# Patient Record
Sex: Male | Born: 1975 | Race: Black or African American | Hispanic: No | Marital: Single | Smoking: Former smoker
Health system: Southern US, Community
[De-identification: ages and names within clinical notes are randomized; demographics above are authoritative.]

## PROBLEM LIST (undated history)

## (undated) DIAGNOSIS — Z950 Presence of cardiac pacemaker: Secondary | ICD-10-CM

## (undated) DIAGNOSIS — I10 Essential (primary) hypertension: Secondary | ICD-10-CM

## (undated) DIAGNOSIS — I509 Heart failure, unspecified: Secondary | ICD-10-CM

## (undated) DIAGNOSIS — S0689AA Other specified intracranial injury with loss of consciousness status unknown, initial encounter: Secondary | ICD-10-CM

## (undated) DIAGNOSIS — Z95810 Presence of automatic (implantable) cardiac defibrillator: Secondary | ICD-10-CM

## (undated) DIAGNOSIS — E119 Type 2 diabetes mellitus without complications: Secondary | ICD-10-CM

## (undated) HISTORY — DX: Other specified intracranial injury with loss of consciousness status unknown, initial encounter: S06.89AA

---

## 2021-04-26 ENCOUNTER — Inpatient Hospital Stay (HOSPITAL_COMMUNITY)
Admission: EM | Admit: 2021-04-26 | Discharge: 2021-05-06 | DRG: 026 | Disposition: A | Discharge status: Home / Self Care | Payer: Medicare PPO | Attending: Internal Medicine | Admitting: Internal Medicine

## 2021-04-26 ENCOUNTER — Emergency Department (HOSPITAL_COMMUNITY): Payer: Medicare PPO

## 2021-04-26 ENCOUNTER — Inpatient Hospital Stay (HOSPITAL_COMMUNITY): Payer: Medicare PPO

## 2021-04-26 DIAGNOSIS — R29810 Facial weakness: Secondary | ICD-10-CM | POA: Diagnosis present

## 2021-04-26 DIAGNOSIS — E1122 Type 2 diabetes mellitus with diabetic chronic kidney disease: Secondary | ICD-10-CM | POA: Diagnosis present

## 2021-04-26 DIAGNOSIS — R569 Unspecified convulsions: Principal | ICD-10-CM

## 2021-04-26 DIAGNOSIS — K5730 Diverticulosis of large intestine without perforation or abscess without bleeding: Secondary | ICD-10-CM | POA: Diagnosis present

## 2021-04-26 DIAGNOSIS — Z95810 Presence of automatic (implantable) cardiac defibrillator: Secondary | ICD-10-CM | POA: Diagnosis not present

## 2021-04-26 DIAGNOSIS — I2510 Atherosclerotic heart disease of native coronary artery without angina pectoris: Secondary | ICD-10-CM | POA: Diagnosis present

## 2021-04-26 DIAGNOSIS — E1165 Type 2 diabetes mellitus with hyperglycemia: Secondary | ICD-10-CM | POA: Diagnosis present

## 2021-04-26 DIAGNOSIS — I130 Hypertensive heart and chronic kidney disease with heart failure and stage 1 through stage 4 chronic kidney disease, or unspecified chronic kidney disease: Secondary | ICD-10-CM | POA: Diagnosis present

## 2021-04-26 DIAGNOSIS — K648 Other hemorrhoids: Secondary | ICD-10-CM | POA: Diagnosis present

## 2021-04-26 DIAGNOSIS — C7931 Secondary malignant neoplasm of brain: Secondary | ICD-10-CM

## 2021-04-26 DIAGNOSIS — Z4502 Encounter for adjustment and management of automatic implantable cardiac defibrillator: Secondary | ICD-10-CM | POA: Diagnosis not present

## 2021-04-26 DIAGNOSIS — K633 Ulcer of intestine: Secondary | ICD-10-CM | POA: Diagnosis present

## 2021-04-26 DIAGNOSIS — I5042 Chronic combined systolic (congestive) and diastolic (congestive) heart failure: Secondary | ICD-10-CM | POA: Diagnosis present

## 2021-04-26 DIAGNOSIS — Z20822 Contact with and (suspected) exposure to covid-19: Secondary | ICD-10-CM | POA: Diagnosis present

## 2021-04-26 DIAGNOSIS — R9431 Abnormal electrocardiogram [ECG] [EKG]: Secondary | ICD-10-CM | POA: Diagnosis not present

## 2021-04-26 DIAGNOSIS — D123 Benign neoplasm of transverse colon: Secondary | ICD-10-CM | POA: Diagnosis not present

## 2021-04-26 DIAGNOSIS — E11649 Type 2 diabetes mellitus with hypoglycemia without coma: Secondary | ICD-10-CM | POA: Diagnosis not present

## 2021-04-26 DIAGNOSIS — I429 Cardiomyopathy, unspecified: Secondary | ICD-10-CM | POA: Diagnosis present

## 2021-04-26 DIAGNOSIS — G8191 Hemiplegia, unspecified affecting right dominant side: Secondary | ICD-10-CM | POA: Diagnosis present

## 2021-04-26 DIAGNOSIS — K529 Noninfective gastroenteritis and colitis, unspecified: Secondary | ICD-10-CM | POA: Diagnosis present

## 2021-04-26 DIAGNOSIS — G939 Disorder of brain, unspecified: Secondary | ICD-10-CM | POA: Diagnosis present

## 2021-04-26 DIAGNOSIS — E782 Mixed hyperlipidemia: Secondary | ICD-10-CM | POA: Diagnosis present

## 2021-04-26 DIAGNOSIS — Z634 Disappearance and death of family member: Secondary | ICD-10-CM

## 2021-04-26 DIAGNOSIS — Z86010 Personal history of colonic polyps: Secondary | ICD-10-CM

## 2021-04-26 DIAGNOSIS — R9089 Other abnormal findings on diagnostic imaging of central nervous system: Secondary | ICD-10-CM | POA: Diagnosis not present

## 2021-04-26 DIAGNOSIS — N182 Chronic kidney disease, stage 2 (mild): Secondary | ICD-10-CM | POA: Diagnosis present

## 2021-04-26 DIAGNOSIS — R911 Solitary pulmonary nodule: Secondary | ICD-10-CM | POA: Diagnosis present

## 2021-04-26 DIAGNOSIS — Z8719 Personal history of other diseases of the digestive system: Secondary | ICD-10-CM | POA: Diagnosis not present

## 2021-04-26 DIAGNOSIS — D122 Benign neoplasm of ascending colon: Secondary | ICD-10-CM | POA: Diagnosis not present

## 2021-04-26 DIAGNOSIS — Z7984 Long term (current) use of oral hypoglycemic drugs: Secondary | ICD-10-CM

## 2021-04-26 DIAGNOSIS — E669 Obesity, unspecified: Secondary | ICD-10-CM | POA: Diagnosis present

## 2021-04-26 DIAGNOSIS — Z794 Long term (current) use of insulin: Secondary | ICD-10-CM

## 2021-04-26 DIAGNOSIS — E0869 Diabetes mellitus due to underlying condition with other specified complication: Secondary | ICD-10-CM | POA: Diagnosis not present

## 2021-04-26 DIAGNOSIS — Z6831 Body mass index (BMI) 31.0-31.9, adult: Secondary | ICD-10-CM | POA: Diagnosis not present

## 2021-04-26 DIAGNOSIS — Z79899 Other long term (current) drug therapy: Secondary | ICD-10-CM

## 2021-04-26 DIAGNOSIS — R933 Abnormal findings on diagnostic imaging of other parts of digestive tract: Secondary | ICD-10-CM | POA: Diagnosis not present

## 2021-04-26 DIAGNOSIS — R634 Abnormal weight loss: Secondary | ICD-10-CM | POA: Diagnosis not present

## 2021-04-26 DIAGNOSIS — Z7982 Long term (current) use of aspirin: Secondary | ICD-10-CM

## 2021-04-26 DIAGNOSIS — D120 Benign neoplasm of cecum: Secondary | ICD-10-CM | POA: Diagnosis present

## 2021-04-26 DIAGNOSIS — K559 Vascular disorder of intestine, unspecified: Secondary | ICD-10-CM | POA: Diagnosis present

## 2021-04-26 DIAGNOSIS — Z87891 Personal history of nicotine dependence: Secondary | ICD-10-CM

## 2021-04-26 DIAGNOSIS — N183 Chronic kidney disease, stage 3 unspecified: Secondary | ICD-10-CM | POA: Diagnosis not present

## 2021-04-26 HISTORY — DX: Type 2 diabetes mellitus without complications: E11.9

## 2021-04-26 HISTORY — DX: Heart failure, unspecified: I50.9

## 2021-04-26 HISTORY — DX: Presence of automatic (implantable) cardiac defibrillator: Z95.810

## 2021-04-26 HISTORY — DX: Presence of cardiac pacemaker: Z95.0

## 2021-04-26 HISTORY — DX: Essential (primary) hypertension: I10

## 2021-04-26 LAB — CBC
HCT: 47 % (ref 39.0–52.0)
Hemoglobin: 15.6 g/dL (ref 13.0–17.0)
MCH: 29.9 pg (ref 26.0–34.0)
MCHC: 33.2 g/dL (ref 30.0–36.0)
MCV: 90 fL (ref 80.0–100.0)
Platelets: 286 10*3/uL (ref 150–400)
RBC: 5.22 MIL/uL (ref 4.22–5.81)
RDW: 13 % (ref 11.5–15.5)
WBC: 6 10*3/uL (ref 4.0–10.5)
nRBC: 0 % (ref 0.0–0.2)

## 2021-04-26 LAB — DIFFERENTIAL
Abs Immature Granulocytes: 0.02 10*3/uL (ref 0.00–0.07)
Basophils Absolute: 0 10*3/uL (ref 0.0–0.1)
Basophils Relative: 1 %
Eosinophils Absolute: 0 10*3/uL (ref 0.0–0.5)
Eosinophils Relative: 0 %
Immature Granulocytes: 0 %
Lymphocytes Relative: 18 %
Lymphs Abs: 1.1 10*3/uL (ref 0.7–4.0)
Monocytes Absolute: 0.4 10*3/uL (ref 0.1–1.0)
Monocytes Relative: 7 %
Neutro Abs: 4.4 10*3/uL (ref 1.7–7.7)
Neutrophils Relative %: 74 %

## 2021-04-26 LAB — I-STAT CHEM 8, ED
BUN: 12 mg/dL (ref 6–20)
Calcium, Ion: 1 mmol/L — ABNORMAL LOW (ref 1.15–1.40)
Chloride: 98 mmol/L (ref 98–111)
Creatinine, Ser: 1 mg/dL (ref 0.61–1.24)
Glucose, Bld: 636 mg/dL (ref 70–99)
HCT: 49 % (ref 39.0–52.0)
Hemoglobin: 16.7 g/dL (ref 13.0–17.0)
Potassium: 4.3 mmol/L (ref 3.5–5.1)
Sodium: 133 mmol/L — ABNORMAL LOW (ref 135–145)
TCO2: 24 mmol/L (ref 22–32)

## 2021-04-26 LAB — COMPREHENSIVE METABOLIC PANEL
ALT: 12 U/L (ref 0–44)
AST: 19 U/L (ref 15–41)
Albumin: 3.6 g/dL (ref 3.5–5.0)
Alkaline Phosphatase: 84 U/L (ref 38–126)
Anion gap: 12 (ref 5–15)
BUN: 11 mg/dL (ref 6–20)
CO2: 23 mmol/L (ref 22–32)
Calcium: 9.1 mg/dL (ref 8.9–10.3)
Chloride: 96 mmol/L — ABNORMAL LOW (ref 98–111)
Creatinine, Ser: 1.17 mg/dL (ref 0.61–1.24)
GFR, Estimated: >60 mL/min (ref >60)
Glucose, Bld: 610 mg/dL (ref 70–99)
Potassium: 4.4 mmol/L (ref 3.5–5.1)
Sodium: 131 mmol/L — ABNORMAL LOW (ref 135–145)
Total Bilirubin: 0.8 mg/dL (ref 0.3–1.2)
Total Protein: 7.4 g/dL (ref 6.5–8.1)

## 2021-04-26 LAB — BASIC METABOLIC PANEL
Anion gap: 12 (ref 5–15)
BUN: 10 mg/dL (ref 6–20)
CO2: 19 mmol/L — ABNORMAL LOW (ref 22–32)
Calcium: 8.9 mg/dL (ref 8.9–10.3)
Chloride: 104 mmol/L (ref 98–111)
Creatinine, Ser: 0.91 mg/dL (ref 0.61–1.24)
GFR, Estimated: >60 mL/min (ref >60)
Glucose, Bld: 273 mg/dL — ABNORMAL HIGH (ref 70–99)
Potassium: 4.5 mmol/L (ref 3.5–5.1)
Sodium: 135 mmol/L (ref 135–145)

## 2021-04-26 LAB — MAGNESIUM: Magnesium: 2.2 mg/dL (ref 1.7–2.4)

## 2021-04-26 LAB — RAPID HIV SCREEN (HIV 1/2 AB+AG)
HIV 1/2 Antibodies: NONREACTIVE
HIV-1 P24 Antigen - HIV24: NONREACTIVE

## 2021-04-26 LAB — CBG MONITORING, ED
Glucose-Capillary: >600 mg/dL (ref 70–99)
Glucose-Capillary: 391 mg/dL — ABNORMAL HIGH (ref 70–99)

## 2021-04-26 LAB — TROPONIN I (HIGH SENSITIVITY)
Troponin I (High Sensitivity): 25 ng/L — ABNORMAL HIGH (ref <18)
Troponin I (High Sensitivity): 22 ng/L — ABNORMAL HIGH (ref <18)

## 2021-04-26 LAB — OSMOLALITY: Osmolality: 308 mOsm/kg — ABNORMAL HIGH (ref 275–295)

## 2021-04-26 LAB — URINALYSIS, ROUTINE W REFLEX MICROSCOPIC
Bacteria, UA: NONE SEEN
Bilirubin Urine: NEGATIVE
Glucose, UA: >=500 mg/dL — AB
Hgb urine dipstick: NEGATIVE
Ketones, ur: NEGATIVE mg/dL
Leukocytes,Ua: NEGATIVE
Nitrite: NEGATIVE
Protein, ur: 100 mg/dL — AB
Specific Gravity, Urine: 1.026 (ref 1.005–1.030)
pH: 6 (ref 5.0–8.0)

## 2021-04-26 LAB — GLUCOSE, CAPILLARY
Glucose-Capillary: 197 mg/dL — ABNORMAL HIGH (ref 70–99)
Glucose-Capillary: 338 mg/dL — ABNORMAL HIGH (ref 70–99)

## 2021-04-26 LAB — I-STAT VENOUS BLOOD GAS, ED
Acid-Base Excess: 0 mmol/L (ref 0.0–2.0)
Bicarbonate: 24.9 mmol/L (ref 20.0–28.0)
Calcium, Ion: 1.07 mmol/L — ABNORMAL LOW (ref 1.15–1.40)
HCT: 49 % (ref 39.0–52.0)
Hemoglobin: 16.7 g/dL (ref 13.0–17.0)
O2 Saturation: 99 %
Potassium: 4.3 mmol/L (ref 3.5–5.1)
Sodium: 132 mmol/L — ABNORMAL LOW (ref 135–145)
TCO2: 26 mmol/L (ref 22–32)
pCO2, Ven: 39.3 mmHg — ABNORMAL LOW (ref 44.0–60.0)
pH, Ven: 7.41 (ref 7.250–7.430)
pO2, Ven: 153 mmHg — ABNORMAL HIGH (ref 32.0–45.0)

## 2021-04-26 LAB — PROTIME-INR
INR: 1 (ref 0.8–1.2)
Prothrombin Time: 12.9 seconds (ref 11.4–15.2)

## 2021-04-26 LAB — APTT: aPTT: 26 seconds (ref 24–36)

## 2021-04-26 LAB — PHOSPHORUS: Phosphorus: 3 mg/dL (ref 2.5–4.6)

## 2021-04-26 LAB — BETA-HYDROXYBUTYRIC ACID: Beta-Hydroxybutyric Acid: 0.34 mmol/L — ABNORMAL HIGH (ref 0.05–0.27)

## 2021-04-26 MED ORDER — ATORVASTATIN CALCIUM 80 MG PO TABS
80.0000 mg | ORAL_TABLET | Freq: Every day | ORAL | Status: DC
  Administered 2021-04-26 – 2021-05-06 (×11): 80 mg via ORAL
  Filled 2021-04-26: qty 1
  Filled 2021-04-26: qty 2
  Filled 2021-04-26 (×9): qty 1

## 2021-04-26 MED ORDER — STROKE: EARLY STAGES OF RECOVERY BOOK
Freq: Once | Status: DC
  Filled 2021-04-26: qty 1

## 2021-04-26 MED ORDER — LORAZEPAM 2 MG/ML IJ SOLN
INTRAMUSCULAR | Status: AC
  Filled 2021-04-26: qty 1

## 2021-04-26 MED ORDER — INSULIN GLARGINE-YFGN 100 UNIT/ML ~~LOC~~ SOLN
10.0000 [IU] | Freq: Once | SUBCUTANEOUS | Status: AC
  Administered 2021-04-26: 10 [IU] via SUBCUTANEOUS
  Filled 2021-04-26: qty 0.1

## 2021-04-26 MED ORDER — CHLORHEXIDINE GLUCONATE 0.12% ORAL RINSE (MEDLINE KIT)
15.0000 mL | Freq: Two times a day (BID) | OROMUCOSAL | Status: DC
  Administered 2021-04-26 – 2021-05-04 (×12): 15 mL via OROMUCOSAL

## 2021-04-26 MED ORDER — ORAL CARE MOUTH RINSE
15.0000 mL | OROMUCOSAL | Status: DC
  Administered 2021-04-26 – 2021-05-04 (×32): 15 mL via OROMUCOSAL

## 2021-04-26 MED ORDER — ONDANSETRON HCL 4 MG PO TABS: 4.0000 mg | ORAL_TABLET | Freq: Four times a day (QID) | ORAL | Status: DC | PRN

## 2021-04-26 MED ORDER — INSULIN ASPART 100 UNIT/ML IJ SOLN
0.0000 [IU] | Freq: Three times a day (TID) | INTRAMUSCULAR | Status: DC
  Administered 2021-04-26: 3 [IU] via SUBCUTANEOUS
  Administered 2021-04-26: 15 [IU] via SUBCUTANEOUS
  Administered 2021-04-27: 8 [IU] via SUBCUTANEOUS
  Administered 2021-04-27: 3 [IU] via SUBCUTANEOUS
  Administered 2021-04-27 – 2021-04-28 (×3): 8 [IU] via SUBCUTANEOUS
  Administered 2021-04-28: 3 [IU] via SUBCUTANEOUS
  Administered 2021-04-29 (×2): 8 [IU] via SUBCUTANEOUS
  Administered 2021-04-29: 5 [IU] via SUBCUTANEOUS
  Administered 2021-04-30: 2 [IU] via SUBCUTANEOUS
  Administered 2021-04-30: 5 [IU] via SUBCUTANEOUS
  Administered 2021-04-30: 3 [IU] via SUBCUTANEOUS
  Administered 2021-05-01: 5 [IU] via SUBCUTANEOUS
  Administered 2021-05-01: 3 [IU] via SUBCUTANEOUS
  Administered 2021-05-01: 8 [IU] via SUBCUTANEOUS
  Administered 2021-05-02 (×2): 3 [IU] via SUBCUTANEOUS
  Administered 2021-05-03: 8 [IU] via SUBCUTANEOUS
  Administered 2021-05-03 (×2): 3 [IU] via SUBCUTANEOUS
  Administered 2021-05-04: 15 [IU] via SUBCUTANEOUS
  Administered 2021-05-04: 2 [IU] via SUBCUTANEOUS
  Administered 2021-05-05 – 2021-05-06 (×4): 8 [IU] via SUBCUTANEOUS
  Administered 2021-05-06: 11 [IU] via SUBCUTANEOUS

## 2021-04-26 MED ORDER — ASPIRIN EC 81 MG PO TBEC
81.0000 mg | DELAYED_RELEASE_TABLET | Freq: Every day | ORAL | Status: DC
  Administered 2021-04-26 – 2021-05-06 (×10): 81 mg via ORAL
  Filled 2021-04-26 (×11): qty 1

## 2021-04-26 MED ORDER — AMIODARONE HCL 200 MG PO TABS
200.0000 mg | ORAL_TABLET | Freq: Every day | ORAL | Status: DC
  Administered 2021-04-26 – 2021-05-06 (×11): 200 mg via ORAL
  Filled 2021-04-26 (×11): qty 1

## 2021-04-26 MED ORDER — CARVEDILOL 12.5 MG PO TABS
25.0000 mg | ORAL_TABLET | Freq: Two times a day (BID) | ORAL | Status: DC
  Administered 2021-04-26 – 2021-05-06 (×19): 25 mg via ORAL
  Filled 2021-04-26 (×19): qty 2

## 2021-04-26 MED ORDER — LORAZEPAM 2 MG/ML IJ SOLN: 4.0000 mg | INTRAMUSCULAR | Status: DC | PRN

## 2021-04-26 MED ORDER — ONDANSETRON HCL 4 MG/2ML IJ SOLN: 4.0000 mg | Freq: Four times a day (QID) | INTRAMUSCULAR | Status: DC | PRN

## 2021-04-26 MED ORDER — SODIUM CHLORIDE 0.9 % IV SOLN: 75.0000 mL/h | INTRAVENOUS | Status: DC

## 2021-04-26 MED ORDER — LORAZEPAM 2 MG/ML IJ SOLN
INTRAMUSCULAR | Status: AC
  Administered 2021-04-26: 2 mg
  Filled 2021-04-26: qty 1

## 2021-04-26 MED ORDER — LEVETIRACETAM IN NACL 1000 MG/100ML IV SOLN
1000.0000 mg | INTRAVENOUS | Status: AC
  Administered 2021-04-26 (×2): 1000 mg via INTRAVENOUS
  Filled 2021-04-26 (×2): qty 100

## 2021-04-26 MED ORDER — ENOXAPARIN SODIUM 40 MG/0.4ML IJ SOSY
40.0000 mg | PREFILLED_SYRINGE | INTRAMUSCULAR | Status: DC
  Administered 2021-04-26 – 2021-04-27 (×2): 40 mg via SUBCUTANEOUS
  Filled 2021-04-26 (×2): qty 0.4

## 2021-04-26 MED ORDER — SODIUM CHLORIDE 0.9% FLUSH
3.0000 mL | Freq: Once | INTRAVENOUS | Status: AC
  Administered 2021-04-26: 3 mL via INTRAVENOUS

## 2021-04-26 MED ORDER — IOHEXOL 350 MG/ML SOLN
60.0000 mL | Freq: Once | INTRAVENOUS | Status: AC | PRN
  Administered 2021-04-26: 60 mL via INTRAVENOUS

## 2021-04-26 MED ORDER — INSULIN ASPART 100 UNIT/ML IJ SOLN
0.0000 [IU] | Freq: Every day | INTRAMUSCULAR | Status: DC
Start: 2021-04-26 — End: 2021-05-06
  Administered 2021-04-26: 4 [IU] via SUBCUTANEOUS
  Administered 2021-04-27 – 2021-04-28 (×2): 5 [IU] via SUBCUTANEOUS
  Administered 2021-04-30 – 2021-05-03 (×3): 3 [IU] via SUBCUTANEOUS
  Administered 2021-05-04: 2 [IU] via SUBCUTANEOUS
  Administered 2021-05-05: 3 [IU] via SUBCUTANEOUS

## 2021-04-26 MED ORDER — SODIUM CHLORIDE 0.9 % IV SOLN
75.0000 mL/h | INTRAVENOUS | Status: AC
  Administered 2021-04-26 – 2021-04-27 (×3): 75 mL/h via INTRAVENOUS

## 2021-04-26 NOTE — ED Notes (Signed)
Patient transported to CT 

## 2021-04-26 NOTE — Consult Note (Addendum)
NEUROLOGY CONSULTATION NOTE   Date of service: April 26, 2021 Patient Name: Jeremy Burns MRN:  [id number] DOB:  25-Oct-1975 Reason for consult: "Seizures and concern for post ictal R sided weakness vs L MCA stroke" Requesting Provider: Lequita Halt, MD _ _ _   _ __   _ __ _ _  __ __   _ __   __ _  History of Present Illness  Jeremy Burns is a 45 y.o. male with PMH significant for DM2, combined systolic and diastolic CHF, CAD, AICD placement, CKD stage 1, mixed hyperlipidemia, HTN who was found down by EMS in front of a stroke. Had a 45 sec seizure enroute. On arrival, not moving Right side, somnolent, and concern for aphasia. Initial CTH w/o contrast with a small insular hypodensity concerning for a stroke. Blood glucose over 600. Vitals otherwise normal. After CT Head, noted to be clearly talking, sat up in the scanner, still weak on the right but able to stand up and urinate. While attempting to get a CT Angio, had about 2 mins of seizure activity with R sided twitching with R gaze deviation. Was given 2mg  of Ativan with resolution. CTA with no LVO.  Loaded with Keppra 2G.   On reevaluation he is able to provide much more history.  Reports that since this morning he has noted that he has had some trouble writing with his right hand.  He did not think too much about it.  He got up in the morning drove over to a shop in his truckand was coming out of the shop and halfway down the shop realize that he left his wallet there.  He went back to get his wallet and that is the last thing that he remembers and then waking up in the emergency department.  mRS: presumed to be 0 LKW: unclear tNK: Not offered, suspect partial seizure is the primary diagnosis and LKW is unclear. Thrombectomy: Not offered due to no LVO. NIHSS components Score: Comment  1a Level of Conscious 0[x]  1[]  2[]  3[]      1b LOC Questions 0[]  1[]  2[x]       1c LOC Commands 0[]  1[]  2[x]       2 Best Gaze 0[x]  1[]  2[]       3 Visual  0[x]  1[]  2[]  3[]      4 Facial Palsy 0[]  1[x]  2[]  3[]      5a Motor Arm - left 0[x]  1[]  2[]  3[]  4[]  UN[]    5b Motor Arm - Right 0[]  1[]  2[]  3[x]  4[]  UN[]    6a Motor Leg - Left 0[x]  1[]  2[]  3[]  4[]  UN[]    6b Motor Leg - Right 0[]  1[]  2[]  3[x]  4[]  UN[]    7 Limb Ataxia 0[x]  1[]  2[]  3[]  UN[]     8 Sensory 0[]  1[]  2[x]  UN[]      9 Best Language 0[]  1[]  2[x]  3[]      10 Dysarthria 0[x]  1[]  2[]  UN[]      11 Extinct. and Inattention 0[]  1[x]  2[]       TOTAL: 15     ROS   Unable to obtain a detailed ROS, PMH, PSH< Fhx, SHx due to post ictal somnolence and post ictal todd's paresis.  Past History  No past medical history on file.  No family history on file. Social History   Socioeconomic History   Marital status: Single    Spouse name: Not on file   Number of children: Not on file   Years of education: Not on file   Highest education  level: Not on file  Occupational History   Not on file  Tobacco Use   Smoking status: Not on file   Smokeless tobacco: Not on file  Substance and Sexual Activity   Alcohol use: Not on file   Drug use: Not on file   Sexual activity: Not on file  Other Topics Concern   Not on file  Social History Narrative   Not on file   Social Determinants of Health   Financial Resource Strain: Not on file  Food Insecurity: Not on file  Transportation Needs: Not on file  Physical Activity: Not on file  Stress: Not on file  Social Connections: Not on file   Not on File  Medications   Medications Prior to Admission  Medication Sig Dispense Refill Last Dose   amiodarone (PACERONE) 200 MG tablet Take 200 mg by mouth daily.      atorvastatin (LIPITOR) 80 MG tablet Take 80 mg by mouth daily.      carvedilol (COREG) 25 MG tablet Take 25 mg by mouth 2 (two) times daily.      furosemide (LASIX) 40 MG tablet Take 40 mg by mouth 2 (two) times daily.      LEVEMIR FLEXTOUCH 100 UNIT/ML FlexTouch Pen Inject into the skin.      losartan (COZAAR) 100 MG tablet Take 100 mg  by mouth daily.      metFORMIN (GLUCOPHAGE-XR) 500 MG 24 hr tablet Take 500 mg by mouth every morning.      Potassium Chloride ER 20 MEQ TBCR Take 2 tablets by mouth daily.      spironolactone (ALDACTONE) 25 MG tablet Take 25 mg by mouth daily.        Vitals   Vitals:   04/26/21 1415 04/26/21 1515 04/26/21 1644 04/26/21 1700  BP: 118/87 107/78 119/76   Pulse: 82 77 80   Resp: (!) 25 (!) 22 18   Temp:   98.1 F (36.7 C)   TempSrc:   Oral   SpO2: 97% 95% 99%   Weight:    42.6 kg  Height:         Body mass index is 13.48 kg/m.  Physical Exam   General: Laying comfortably in bed; in no acute distress.  HENT: Normal oropharynx and mucosa. Normal external appearance of ears and nose.  Neck: Supple, no pain or tenderness  CV: No JVD. No peripheral edema.  Pulmonary: Symmetric Chest rise. Normal respiratory effort.  Abdomen: Soft to touch, non-tender.  Ext: No cyanosis, edema, or deformity  Skin: No rash. Normal palpation of skin.   Musculoskeletal: Normal digits and nails by inspection. No clubbing.   Neurologic Examination  Mental status/Cognition: Alert, oriented to self, place, month and year, good attention.  Speech/language: Bradyphrenic speech, makes a couple errors but improved. Comprehension intact, object naming intact, repetition intact.  Cranial nerves:   CN II Pupils equal and reactive to light, no VF deficits    CN III,IV,VI EOM intact, no gaze preference or deviation, no nystagmus    CN V normal sensation in V1, V2, and V3 segments bilaterally    CN VII Mild R facial droop.   CN VIII normal hearing to speech    CN IX & X normal palatal elevation, no uvular deviation    CN XI 5/5 head turn and 5/5 shoulder shrug bilaterally    CN XII midline tongue protrusion    Motor:  Muscle bulk: normal, tone normal, pronator drift RUE tremor none Mvmt Root Nerve  Muscle Right Left Comments  SA C5/6 Ax Deltoid 4 5   EF C5/6 Mc Biceps 5 5   EE C6/7/8 Rad Triceps 5 5   WF  C6/7 Med FCR     WE C7/8 PIN ECU     F Ab C8/T1 U ADM/FDI 4+ 5   HF L1/2/3 Fem Illopsoas 4 5   KE L2/3/4 Fem Quad 4 5   DF L4/5 D Peron Tib Ant 5 5   PF S1/2 Tibial Grc/Sol 5 5    Reflexes:  Right Left Comments  Pectoralis      Biceps (C5/6) 2 2   Brachioradialis (C5/6) 2 2    Triceps (C6/7) 2 2    Patellar (L3/4) 2 2    Achilles (S1)      Hoffman      Plantar     Jaw jerk    Sensation:  Light touch Decreased mildly in R face, RUE and RLE   Pin prick    Temperature    Vibration   Proprioception    Coordination/Complex Motor:  - Finger to Nose intact BL - Heel to shin unable to do - Rapid alternating movement are normal - Gait:  Deferred.  Labs   CBC:  Recent Labs  Lab 04/26/21 1002 04/26/21 1009  WBC 6.0  --   NEUTROABS 4.4  --   HGB 15.6 16.7  HCT 47.0 49.0  MCV 90.0  --   PLT 286  --     Basic Metabolic Panel:  Lab Results  Component Value Date   NA 132 (L) 04/26/2021   K 4.3 04/26/2021   CO2 23 04/26/2021   GLUCOSE 610 (HH) 04/26/2021   BUN 11 04/26/2021   CREATININE 1.17 04/26/2021   CALCIUM 9.1 04/26/2021   GFRNONAA >60 04/26/2021   Lipid Panel: No results found for: LDLCALC HgbA1c: No results found for: HGBA1C Urine Drug Screen: No results found for: LABOPIA, COCAINSCRNUR, LABBENZ, AMPHETMU, THCU, LABBARB  Alcohol Level No results found for: [place]  CT Head without contrast: Personally reviewed and CTH was negative for a large hypodensity concerning for a large territory infarct or hyperdensity concerning for an ICH  CT angio Head and Neck with contrast: Personally reviewed and no LVO.  MRI Brain: pending  cEEG:  pending  Impression   Jeremy Burns is a 45 y.o. male with PMH significant for DM2, combined systolic and diastolic CHF, CAD, AICD placement, CKD stage 1, mixed hyperlipidemia, HTN who was found down by EMS in front of a store. Had a 45 sec enroute and another 2 mins seizure in the ED with R sided twitching and LOC. Noted to  be weaker on the right immediately after seizure but now much improved.  Low suspicion for a stroke.  I suspect that his seizures were likely induced by hyperglycemia with glucose over 600 at presentation.  On repeat evaluation, he is much improved on his right side.  However, he has brief intermittent period of spacing out.  He had at least 2 of these episodes each lasted about 10-15 secs when I reevaluated him. He could not recall our conversation during those events.  Recommendations  - Keppra load 2g IV once - Will put him on cEEG given noted 2 brief episodes of spacing out for around 10-15 secs when I re-examined him. - Seizure precautions - Ativan 2mg  Iv once for seizure activity lasting more than 3 mins. - Treatment of hyperglycemia per Primary team. - Unclear if his AICD is  MRI compatible. MRI techs are looking into this. - We will continue to follow along. ______________________________________________________________________  Plan discussed with Dr. Roosevelt Locks over secure chat.  This patient is critically ill and at significant risk of neurological worsening, death and care requires constant monitoring of vital signs, hemodynamics,respiratory and cardiac monitoring, neurological assessment, discussion with family, other specialists and medical decision making of high complexity. I spent 45 minutes of neurocritical care time  in the care of  this patient. This was time spent independent of any time provided by nurse practitioner or PA.  Donnetta Simpers [facility] Pager Number [phone] 04/26/2021  5:15 PM   Thank you for the opportunity to take part in the care of this patient. If you have any further questions, please contact the neurology consultation attending.  Signed,  [place] Pager Number [phone] _ _ _   _ __   _ __ _ _  __ __   _ __   __ _

## 2021-04-26 NOTE — ED Notes (Signed)
Dr. Tinnie Gens notified of pt's symptoms-- code stroke called--

## 2021-04-26 NOTE — ED Provider Notes (Addendum)
[facility] EMERGENCY DEPARTMENT Provider Note   CSN: [id number] Arrival date & time: 04/26/21  0277  An emergency department physician performed an initial assessment on this suspected stroke patient at [phone].  History Chief Complaint  Patient presents with   Seizures   Code Stroke    Jeremy Burns is a 45 y.o. male with PMH T2DM insulin-dependent, HTN, HLD, CHF with last EF 15 to 20%, CKD who presents emergency department for evaluation of seizure-like activity and altered mental status.  Patient was found down in his tractor-trailer and EMS witnessed a 45-second episode of seizure activity with bilateral upper extremity general tonic-clonic behavior.  This behavior self aborted in the field and no medications were given.  Patient arrives altered with inability to raise his right arm.  He is minimally responsive to questions and not responsive to commands.   Seizures     No past medical history on file.  Patient Active Problem List   Diagnosis Date Noted   Seizure ([place]) 04/26/2021         No family history on file.     Home Medications Prior to Admission medications   Not on File    Allergies    Patient has no allergy information on record.  Review of Systems   Review of Systems  Unable to perform ROS: Mental status change  Neurological:  Positive for seizures.   Physical Exam Updated Vital Signs BP 131/89   Pulse 91   Temp 98.7 F (37.1 C) (Oral)   Resp (!) 23   Ht _0  (1.778 m)   SpO2 92%   Physical Exam Vitals and nursing note reviewed.  Constitutional:      Appearance: He is well-developed. He is ill-appearing.  HENT:     Head: Normocephalic and atraumatic.  Eyes:     Conjunctiva/sclera: Conjunctivae normal.  Cardiovascular:     Rate and Rhythm: Normal rate and regular rhythm.     Heart sounds: No murmur heard. Pulmonary:     Effort: Pulmonary effort is normal. No respiratory distress.     Breath sounds: Normal  breath sounds.  Abdominal:     Palpations: Abdomen is soft.     Tenderness: There is no abdominal tenderness.  Musculoskeletal:     Cervical back: Neck supple.  Skin:    General: Skin is warm and dry.  Neurological:     Mental Status: He is alert. He is disoriented.     Cranial Nerves: No cranial nerve deficit.     Motor: Weakness present.    ED Results / Procedures / Treatments   Labs (all labs ordered are listed, but only abnormal results are displayed) Labs Reviewed  COMPREHENSIVE METABOLIC PANEL - Abnormal; Notable for the following components:      Result Value   Sodium 131 (*)    Chloride 96 (*)    Glucose, Bld 610 (*)    All other components within normal limits  BETA-HYDROXYBUTYRIC ACID - Abnormal; Notable for the following components:   Beta-Hydroxybutyric Acid 0.34 (*)    All other components within normal limits  URINALYSIS, ROUTINE W REFLEX MICROSCOPIC - Abnormal; Notable for the following components:   Color, Urine STRAW (*)    Glucose, UA >=500 (*)    Protein, ur 100 (*)    All other components within normal limits  I-STAT CHEM 8, ED - Abnormal; Notable for the following components:   Sodium 133 (*)    Glucose, Bld 636 (*)  Calcium, Ion 1.00 (*)    All other components within normal limits  CBG MONITORING, ED - Abnormal; Notable for the following components:   Glucose-Capillary >600 (*)    All other components within normal limits  I-STAT VENOUS BLOOD GAS, ED - Abnormal; Notable for the following components:   pCO2, Ven 39.3 (*)    pO2, Ven 153.0 (*)    Sodium 132 (*)    Calcium, Ion 1.07 (*)    All other components within normal limits  PROTIME-INR  APTT  CBC  DIFFERENTIAL  BLOOD GAS, VENOUS  HEMOGLOBIN A1C  RAPID HIV SCREEN (HIV 1/2 AB+AG)  RAPID URINE DRUG SCREEN, HOSP PERFORMED  I-STAT CHEM 8, ED  TROPONIN I (HIGH SENSITIVITY)    EKG None  Radiology DG Chest Portable 1 View  Result Date: 04/26/2021 CLINICAL DATA:  Altered mental  status, seizure EXAM: PORTABLE CHEST 1 VIEW COMPARISON:  None. FINDINGS: Dual lead left-sided implanted cardiac device. Cardiomegaly. No focal airspace consolidation, pleural effusion, or pneumothorax. IMPRESSION: Cardiomegaly.  No focal airspace disease. Electronically Signed   By: Davina Poke D.O.   On: 04/26/2021 11:34   CT HEAD CODE STROKE WO CONTRAST  Result Date: 04/26/2021 CLINICAL DATA:  Code stroke.  Neuro deficit, acute, stroke suspected EXAM: CT HEAD WITHOUT CONTRAST TECHNIQUE: Contiguous axial images were obtained from the base of the skull through the vertex without intravenous contrast. COMPARISON:  None. FINDINGS: Brain: Small area of hypoattenuation and loss of gray differentiation in the posterior left insula (series 3, image 16; series 6, image 39). No acute hemorrhage. No mass lesion, midline shift, extra-axial fluid collection, or hydrocephalus. Vascular: No hyperdense vessel identified. Skull: No acute fracture. Sinuses/Orbits: No substantial paranasal sinus disease. Unremarkable orbits. Other: No mastoid effusions. ASPECTS [facility] Stroke Program Early CT Score) - Ganglionic level infarction (caudate, lentiform nuclei, internal capsule, insula, M1-M3 cortex): 6 - Supraganglionic infarction (M4-M6 cortex): 3 Total score (0-10 with 10 being normal): 9 IMPRESSION: 1. Small area of hypoattenuation and loss of gray differentiation in the posterior left insula, possibly small left acute MCA territory infarct versus artifact. ASPECTS 9. MRI could further evaluate if clinically indicated. 2. No acute hemorrhage. Code stroke imaging results were communicated on 04/26/2021 at 10:05 am to provider Dr. Lorrin Goodell via telephone, who verbally acknowledged these results. Electronically Signed   By: Margaretha Sheffield M.D.   On: 04/26/2021 10:10   CT ANGIO HEAD NECK W WO CM (CODE STROKE)  Result Date: 04/26/2021 CLINICAL DATA:  Stroke/TIA, assess intracranial arteries EXAM: CT ANGIOGRAPHY HEAD  AND NECK TECHNIQUE: Multidetector CT imaging of the head and neck was performed using the standard protocol during bolus administration of intravenous contrast. Multiplanar CT image reconstructions and MIPs were obtained to evaluate the vascular anatomy. Carotid stenosis measurements (when applicable) are obtained utilizing NASCET criteria, using the distal internal carotid diameter as the denominator. CONTRAST:  56m OMNIPAQUE IOHEXOL 350 MG/ML SOLN COMPARISON:  None. FINDINGS: CTA NECK FINDINGS Aortic arch: Great vessel origins are patent. Right carotid system: No evidence of dissection, stenosis (50% or greater) or occlusion. Left carotid system: No evidence of dissection, stenosis (50% or greater) or occlusion. Vertebral arteries: Left dominant. No evidence of dissection, stenosis (50% or greater) or occlusion. Skeleton: No evidence of acute abnormality. Other neck: No acute abnormality. Upper chest: Patchy opacities in the visualized lung apices Review of the MIP images confirms the above findings CTA HEAD FINDINGS Evaluation limited by venous contamination.  Within this limitation: Anterior circulation: Bilateral intracranial ICAs, MCAs,  and ACAs are patent without evidence of proximal hemodynamically significant stenosis. Limited evaluation the distal MCA vessels due to venous contamination. Posterior circulation: Small/non dominant right vertebral artery. Bilateral intradural vertebral arteries, basilar artery, and posterior cerebral arteries are patent. Mild stenosis of the distal left intradural vertebral artery. Mild multifocal narrowing of bilateral PCAs. No aneurysm identified. Venous sinuses: No evidence of dural venous sinus thrombosis. Review of the MIP images confirms the above findings IMPRESSION: CTA head: Limited study due to venous contamination. No evidence of large vessel occlusion or proximal hemodynamically significant stenosis. CTA neck: 1. No significant (greater than 50%) stenosis. 2.  Patchy opacities in the visualized lung apices, potentially pneumonia or aspiration but evaluation is limited due to expiratory imaging. Recommend dedicated chest imaging. Urgent findings were communicated on 04/26/2021 at 10:21 am to provider Dr. Lorrin Goodell via telephone, who verbally acknowledged these results. Electronically Signed   By: Margaretha Sheffield M.D.   On: 04/26/2021 10:48    Procedures .Critical Care Performed by: Teressa Lower, MD Authorized by: Teressa Lower, MD   Critical care provider statement:    Critical care time (minutes):  80   Critical care was necessary to treat or prevent imminent or life-threatening deterioration of the following conditions: status epilepticus, stroke.   Critical care was time spent personally by me on the following activities:  Ordering and performing treatments and interventions, ordering and review of laboratory studies, ordering and review of radiographic studies, discussions with consultants, pulse oximetry, examination of patient and blood draw for specimens   Medications Ordered in ED Medications  insulin aspart (novoLOG) injection 0-15 Units (has no administration in time range)  insulin glargine-yfgn (SEMGLEE) injection 10 Units (has no administration in time range)  insulin aspart (novoLOG) injection 0-5 Units (has no administration in time range)  chlorhexidine gluconate (MEDLINE KIT) (PERIDEX) 0.12 % solution 15 mL (has no administration in time range)  MEDLINE mouth rinse (has no administration in time range)  enoxaparin (LOVENOX) injection 40 mg (has no administration in time range)  LORazepam (ATIVAN) injection 4 mg (has no administration in time range)  ondansetron (ZOFRAN) tablet 4 mg (has no administration in time range)    Or  ondansetron (ZOFRAN) injection 4 mg (has no administration in time range)  atorvastatin (LIPITOR) tablet 80 mg (has no administration in time range)  carvedilol (COREG) tablet 25 mg (has no  administration in time range)  amiodarone (PACERONE) tablet 200 mg (has no administration in time range)  0.9 %  sodium chloride infusion (has no administration in time range)  sodium chloride flush (NS) 0.9 % injection 3 mL (3 mLs Intravenous Given 04/26/21 1010)  levETIRAcetam (KEPPRA) IVPB 1000 mg/100 mL premix (0 mg Intravenous Stopped 04/26/21 1029)  LORazepam (ATIVAN) 2 MG/ML injection (2 mg  Given 04/26/21 1010)  iohexol (OMNIPAQUE) 350 MG/ML injection 60 mL (60 mLs Intravenous Contrast Given 04/26/21 1028)    ED Course  I have reviewed the triage vital signs and the nursing notes.  Pertinent labs & imaging results that were available during my care of the patient were reviewed by me and considered in my medical decision making (see chart for details).    MDM Rules/Calculators/A&P                          Patient seen the emergency department for evaluation of seizure-like activity and concern for possible stroke.  Physical exam reveals right-sided weakness in the upper extremities in a patient that  appears to be postictal.  Although the patient's initial presentation was consistent with Todd's paralysis after a seizure, patient has no history of seizures and a stroke alert was called as we cannot rule out intracranial pathology as the source of his seizures.  Patient's head CT did show an area of concern in the posterior left insula, but the patient's CT angiogram was unremarkable and incoordination with neurology, we have lower suspicion for stroke at this time.  While in the CAT scanner, the patient had an additional seizure lasting approximately 3 minutes and requiring Ativan and a Keppra load.  The seizure ultimately resolved and the patient returns to a improved mental status baseline but is not completely back to baseline.  Initial CBG greater than 600 and i-STAT showing a glucose of 636.  Additional lab work-up unremarkable with a pH of 7.4 and a very mildly elevated beta  hydroxybutyrate to 0.34.  Patient given sliding scale insulin and very gentle fluid resuscitation as to not fluid overload the patient in the setting of cardiomyopathy.  Neurology recommending continuous EEG as the patient still is not back to baseline, and may be displaying signs of nonconvulsive status.  The patient was admitted to medicine for further monitoring and observation.  Final Clinical Impression(s) / ED Diagnoses Final diagnoses:  Seizure [facility])    Rx / [place] Orders ED Discharge Orders     None        Cherice Glennie, MD 04/26/21 1250    Teressa Lower, MD 04/26/21 1250

## 2021-04-26 NOTE — H&P (Signed)
History and Physical    Jeremy Burns YYT:[id number] DOB: [birth year] DOA: 04/26/2021  PCP: No primary care provider on file. (Confirm with patient/family/NH records and if not entered, this has to be entered at [facility] point of entry) Patient coming from: Home  I have personally briefly reviewed patient's old medical records in [place]  Chief Complaint: Feeling foggy  HPI: Jeremy Burns is a 45 y.o. 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  Patient lives in [place], traveled to [place].  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  Patient reported that he felt fine before he went to sleep last night.  This morning, patient woke up, started to feel foggy and speech was really slow, can't think quite straight", while at the corner store, patient was found collapsed.  EMS arrived and found patient unresponsive, breathing on his own.  While en route to the hospital, patient had 45 seconds of tonic clonic seizure.  On arrival it was found patient had right-sided facial droop and right arm flaccid.  Patient is more awake now, denied any headache, no numbness only weakness of any of the limbs, not tongue bite, no loss of urine or bowel movement.  Denied AICD firing.  ED Course: Vital signs, blood pressure and heart rate within normal limits, afebrile, CT head small area of hypoattenuation loss of gray differentiation in the posterior left insula suspicious for small left acute MCA territory infarct.  Blood work glucose more than 600, mild elevation of beta hydroxy, VBG showed normal pH.  Anion gap within normal limits.  Patient had another episode of tonic-clonic seizure in the ED and Ativan was given and seizure stopped.  Patient postictal.  Review of Systems: As per HPI otherwise  14 point review of systems negative.    No past medical history on file.     has no history on file for tobacco use, alcohol use, and drug use.  Not on File  No family history on file.   Prior to Admission medications   Medication Sig Start Date End Date Taking? Authorizing Provider  amiodarone (PACERONE) 200 MG tablet Take 200 mg by mouth daily. 11/05/20   [provider]  atorvastatin (LIPITOR) 80 MG tablet Take 80 mg by mouth daily. 11/05/20   [provider]  carvedilol (COREG) 25 MG tablet Take 25 mg by mouth 2 (two) times daily. 11/05/20   [provider]  furosemide (LASIX) 40 MG tablet Take 40 mg by mouth 2 (two) times daily. 11/05/20   [provider]  LEVEMIR FLEXTOUCH 100 UNIT/ML FlexTouch Pen Inject into the skin. 04/23/21   [provider]  losartan (COZAAR) 100 MG tablet Take 100 mg by mouth daily. 02/22/21   [provider]  metFORMIN (GLUCOPHAGE-XR) 500 MG 24 hr tablet Take 500 mg by mouth every morning. 11/05/20   [provider]  Potassium Chloride ER 20 MEQ TBCR Take 2 tablets by mouth daily. 02/20/21   [provider]  spironolactone (ALDACTONE) 25 MG tablet Take 25 mg by mouth daily. 03/08/21   [provider]    Physical Exam: Vitals:   04/26/21 1145 04/26/21 1200 04/26/21 1215 04/26/21 1230  BP: (!) 128/92 123/83 125/80 131/89  Pulse: 92 89 (!) 41 91  Resp: 17 14 20  (!) 23  Temp:  TempSrc:      SpO2: 93% 95% 93% 92%  Height:        Constitutional: NAD, calm, comfortable Vitals:   04/26/21 1145 04/26/21 1200 04/26/21 1215 04/26/21 1230  BP: (!) 128/92 123/83 125/80 131/89  Pulse: 92 89 (!) 41 91  Resp: 17 14 20  (!) 23  Temp:      TempSrc:      SpO2: 93% 95% 93% 92%  Height:       Eyes: PERRL, lids and conjunctivae normal ENMT: Mucous membranes are dry. Posterior pharynx clear of any exudate or lesions.Normal dentition.  Neck: normal, supple, no masses, no  thyromegaly Respiratory: clear to auscultation bilaterally, no wheezing, no crackles. Normal respiratory effort. No accessory muscle use.  Cardiovascular: Regular rate and rhythm, no murmurs / rubs / gallops. No extremity edema. 2+ pedal pulses. No carotid bruits.  Abdomen: no tenderness, no masses palpated. No hepatosplenomegaly. Bowel sounds positive.  Musculoskeletal: no clubbing / cyanosis. No joint deformity upper and lower extremities. Good ROM, no contractures. Normal muscle tone.  Skin: no rashes, lesions, ulcers. No induration Neurologic: CN 2-12 grossly intact. Sensation intact, DTR normal. Strength 5/5 in all 4.  No meningeal sign Psychiatric: Normal judgment and insight. Alert and oriented x 3. Normal mood.     Labs on Admission: I have personally reviewed following labs and imaging studies  CBC: Recent Labs  Lab 04/26/21 0959 04/26/21 1002 04/26/21 1009  WBC  --  6.0  --   NEUTROABS  --  4.4  --   HGB 16.7 15.6 16.7  HCT 49.0 47.0 49.0  MCV  --  90.0  --   PLT  --  286  --    Basic Metabolic Panel: Recent Labs  Lab 04/26/21 0959 04/26/21 1002 04/26/21 1009  NA 133* 131* 132*  K 4.3 4.4 4.3  CL 98 96*  --   CO2  --  23  --   GLUCOSE 636* 610*  --   BUN 12 11  --   CREATININE 1.00 1.17  --   CALCIUM  --  9.1  --    GFR: CrCl cannot be calculated (Unknown ideal weight.). Liver Function Tests: Recent Labs  Lab 04/26/21 1002  AST 19  ALT 12  ALKPHOS 84  BILITOT 0.8  PROT 7.4  ALBUMIN 3.6   No results for input(s): LIPASE, AMYLASE in the last 168 hours. No results for input(s): AMMONIA in the last 168 hours. Coagulation Profile: Recent Labs  Lab 04/26/21 1002  INR 1.0   Cardiac Enzymes: No results for input(s): CKTOTAL, CKMB, CKMBINDEX, TROPONINI in the last 168 hours. BNP (last 3 results) No results for input(s): PROBNP in the last 8760 hours. HbA1C: No results for input(s): HGBA1C in the last 72 hours. CBG: Recent Labs  Lab 04/26/21 1001   GLUCAP >600*   Lipid Profile: No results for input(s): CHOL, HDL, LDLCALC, TRIG, CHOLHDL, LDLDIRECT in the last 72 hours. Thyroid Function Tests: No results for input(s): TSH, T4TOTAL, FREET4, T3FREE, THYROIDAB in the last 72 hours. Anemia Panel: No results for input(s): VITAMINB12, FOLATE, FERRITIN, TIBC, IRON, RETICCTPCT in the last 72 hours. Urine analysis:    Component Value Date/Time   COLORURINE STRAW (A) 04/26/2021 1030   APPEARANCEUR CLEAR 04/26/2021 1030   LABSPEC 1.026 04/26/2021 1030   PHURINE 6.0 04/26/2021 1030   GLUCOSEU >=500 (A) 04/26/2021 1030   HGBUR NEGATIVE 04/26/2021 1030   [place] 04/26/2021 [place] 04/26/2021 1030  PROTEINUR 100 (A) 04/26/2021 1030   NITRITE NEGATIVE 04/26/2021 1030   LEUKOCYTESUR NEGATIVE 04/26/2021 1030    Radiological Exams on Admission: DG Chest Portable 1 View  Result Date: 04/26/2021 CLINICAL DATA:  Altered mental status, seizure EXAM: PORTABLE CHEST 1 VIEW COMPARISON:  None. FINDINGS: Dual lead left-sided implanted cardiac device. Cardiomegaly. No focal airspace consolidation, pleural effusion, or pneumothorax. IMPRESSION: Cardiomegaly.  No focal airspace disease. Electronically Signed   By: Davina Poke D.O.   On: 04/26/2021 11:34   CT HEAD CODE STROKE WO CONTRAST  Result Date: 04/26/2021 CLINICAL DATA:  Code stroke.  Neuro deficit, acute, stroke suspected EXAM: CT HEAD WITHOUT CONTRAST TECHNIQUE: Contiguous axial images were obtained from the base of the skull through the vertex without intravenous contrast. COMPARISON:  None. FINDINGS: Brain: Small area of hypoattenuation and loss of gray differentiation in the posterior left insula (series 3, image 16; series 6, image 39). No acute hemorrhage. No mass lesion, midline shift, extra-axial fluid collection, or hydrocephalus. Vascular: No hyperdense vessel identified. Skull: No acute fracture. Sinuses/Orbits: No substantial paranasal sinus disease.  Unremarkable orbits. Other: No mastoid effusions. ASPECTS [facility] Stroke Program Early CT Score) - Ganglionic level infarction (caudate, lentiform nuclei, internal capsule, insula, M1-M3 cortex): 6 - Supraganglionic infarction (M4-M6 cortex): 3 Total score (0-10 with 10 being normal): 9 IMPRESSION: 1. Small area of hypoattenuation and loss of gray differentiation in the posterior left insula, possibly small left acute MCA territory infarct versus artifact. ASPECTS 9. MRI could further evaluate if clinically indicated. 2. No acute hemorrhage. Code stroke imaging results were communicated on 04/26/2021 at 10:05 am to provider Dr. Lorrin Goodell via telephone, who verbally acknowledged these results. Electronically Signed   By: Margaretha Sheffield M.D.   On: 04/26/2021 10:10   CT ANGIO HEAD NECK W WO CM (CODE STROKE)  Result Date: 04/26/2021 CLINICAL DATA:  Stroke/TIA, assess intracranial arteries EXAM: CT ANGIOGRAPHY HEAD AND NECK TECHNIQUE: Multidetector CT imaging of the head and neck was performed using the standard protocol during bolus administration of intravenous contrast. Multiplanar CT image reconstructions and MIPs were obtained to evaluate the vascular anatomy. Carotid stenosis measurements (when applicable) are obtained utilizing NASCET criteria, using the distal internal carotid diameter as the denominator. CONTRAST:  81mL OMNIPAQUE IOHEXOL 350 MG/ML SOLN COMPARISON:  None. FINDINGS: CTA NECK FINDINGS Aortic arch: Great vessel origins are patent. Right carotid system: No evidence of dissection, stenosis (50% or greater) or occlusion. Left carotid system: No evidence of dissection, stenosis (50% or greater) or occlusion. Vertebral arteries: Left dominant. No evidence of dissection, stenosis (50% or greater) or occlusion. Skeleton: No evidence of acute abnormality. Other neck: No acute abnormality. Upper chest: Patchy opacities in the visualized lung apices Review of the MIP images confirms the above  findings CTA HEAD FINDINGS Evaluation limited by venous contamination.  Within this limitation: Anterior circulation: Bilateral intracranial ICAs, MCAs, and ACAs are patent without evidence of proximal hemodynamically significant stenosis. Limited evaluation the distal MCA vessels due to venous contamination. Posterior circulation: Small/non dominant right vertebral artery. Bilateral intradural vertebral arteries, basilar artery, and posterior cerebral arteries are patent. Mild stenosis of the distal left intradural vertebral artery. Mild multifocal narrowing of bilateral PCAs. No aneurysm identified. Venous sinuses: No evidence of dural venous sinus thrombosis. Review of the MIP images confirms the above findings IMPRESSION: CTA head: Limited study due to venous contamination. No evidence of large vessel occlusion or proximal hemodynamically significant stenosis. CTA neck: 1. No significant (greater than 50%) stenosis. 2. Patchy opacities  in the visualized lung apices, potentially pneumonia or aspiration but evaluation is limited due to expiratory imaging. Recommend dedicated chest imaging. Urgent findings were communicated on 04/26/2021 at 10:21 am to provider Dr. Lorrin Goodell via telephone, who verbally acknowledged these results. Electronically Signed   By: Margaretha Sheffield M.D.   On: 04/26/2021 10:48    EKG: Independently reviewed. LBBB (chronic? ) With DVT impression V4 through V6  Assessment/Plan Active Problems:   Seizure ([place])  (please populate well all problems here in Problem List. (For example, if patient is on BP meds at home and you resume or decide to hold them, it is a problem that needs to be her. Same for CAD, COPD, HLD and so on)  New onset of seizure -Still somewhat postictal.  Given patient has had 2 episodes seizure today, neurology recommend patient admitted to PCU and continue with EEG monitoring. -Keppra loading completed, additional Keppra as per neurology. -Seizure  precautions -UDS  Question of TIA -Not sure whether this was part of new onset seizure/prodrome -Add aspirin, continue statin -CT had suspicious for left MCA territory infarct, MRI ordered.  As per cardiology note from [place] fear in 2019, patient had MRI safe dual-chamber AICD/pacemaker insertion in 2018. -Echocardiogram. -Significant history of chronic systolic CHF, will consult permissive hypertension given high risk of CHF decompensation.  IIDM with hyperglycemia, probably impending HHS -Received IV bolus, hold metoprolol due to significant history of chronic systolic CHF and reduced LVEF less than 20%. -IV fluid -Repeat BMP in 4 hours, sliding scale for now.  Chronic systolic CHF -Clinically appears to be severely dehydrated, fluid hydration normal saline 75 mill per hour x1 day -Hold Lasix for today -Continue Coreg  History of paroxysmal VT on AICD and pacemaker -Patient denied AICD firing today.  Check enzymes, magnesium and phosphorus.  Abnormal EKG -No chest pains, denied AICD firing -Check troponin levels. -Echocardiogram  DVT prophylaxis: Lovenox Code Status: Full code Family Communication: Has a sister but can not provide contact info this time Disposition Plan: Expect more than 2 midnight hospital stay Consults called: Neuro Admission status: PCU   Lequita Halt MD [facility] Pager [phone]  04/26/2021, 12:54 PM

## 2021-04-26 NOTE — ED Notes (Signed)
Stroke Response Nurse Documentation Code Documentation  Jeremy Burns is a 45 y.o. male arriving to [facility] ED via  EMS  on 04/26/21 with past medical hx of CAD, CKD, HTN. On No antithrombotic. Code stroke was activated by Trauma Response Nurse who admitted the patient.   Patient from work where [employer] unclear. 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 In route to ED patient had witnessed seizure lasting 45 seconds. Afterwards, patient had had right sided facial droop and right sided weakness.  In CT patient stood up and said "he needed to pee" and proceeded to stand up and use the urinal. During this time patient slightly improved however still had some significant aphasia.   During CTA patient had another witnessed seizure beginning at 1007. 2 mg Ativan given at 1010. 2g Keppra started at 1015.   Patient returned to ED-29 with ED physician and RN. Patient able to answer simple question at this time.  NIHSS 12, see documentation for details and code stroke times. Patient with disoriented, right facial droop, right arm weakness, right leg weakness, and Expressive aphasia  on exam. The following imaging was completed: CT, CTA. Patient is not a candidate for IV Thrombolytic due to presentation and possibility of this being seizure related.. Patient is not a candidate for IR due to low suspicion for LVO.   Care/Plan: Continue to monitor for Seizures, NIHSS and vitals q2.   Bedside handoff with ED RN Peter Congo.    Meda Klinefelter  Stroke Response RN

## 2021-04-26 NOTE — Progress Notes (Signed)
LTM setup at bedside. No skin breakdown noted. No MRI leads used.

## 2021-04-26 NOTE — Progress Notes (Signed)
Met sister at bedside, information updated, all questions answered with my best knowledge.

## 2021-04-26 NOTE — ED Triage Notes (Addendum)
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  On arrival-- pt is nonverbal, right facial droop, right arm flaccid--  IV in left forearm per EMS  Pt has a pacemaker but is nonverbal, unable to tell what type of pacemaker he has.

## 2021-04-26 NOTE — Progress Notes (Signed)
EEG done at bedside. Results pending.  

## 2021-04-26 NOTE — Procedures (Signed)
Patient Name: Jayln Madeira  MRN: [id number]  Epilepsy Attending: Lora Havens  Referring Physician/Provider: Dr Donnetta Simpers Date: 04/26/2021 Duration: 25.05 mins  Patient history: 45 year old male with 2 minutes of seizure activity described as right-sided twitching with right gaze deviation followed by right-sided weakness.  EEG to evaluate for seizures.  Level of alertness: Awake, asleep  AEDs during EEG study: LEV  Technical aspects: This EEG study was done with scalp electrodes positioned according to the 10-20 International system of electrode placement. Electrical activity was acquired at a sampling rate of 500Hz  and reviewed with a high frequency filter of 70Hz  and a low frequency filter of 1Hz . EEG data were recorded continuously and digitally stored.   Description: The posterior dominant rhythm consists of 9 Hz activity of moderate voltage (25-35 uV) seen predominantly in posterior head regions, symmetric and reactive to eye opening and eye closing. Physiologic photic driving was not seen during photic stimulation.  Hyperventilation was not performed.     IMPRESSION: This study is within normal limits. No seizures or epileptiform discharges were seen throughout the recording.  Jimmye Wisnieski Barbra Sarks

## 2021-04-26 NOTE — ED Notes (Signed)
Got patient into a gown on the monitor did ekg shown to the er provider

## 2021-04-27 ENCOUNTER — Inpatient Hospital Stay (HOSPITAL_COMMUNITY): Payer: Medicare PPO

## 2021-04-27 DIAGNOSIS — R9431 Abnormal electrocardiogram [ECG] [EKG]: Secondary | ICD-10-CM

## 2021-04-27 LAB — CBC WITH DIFFERENTIAL/PLATELET
Abs Immature Granulocytes: 0.02 10*3/uL (ref 0.00–0.07)
Basophils Absolute: 0 10*3/uL (ref 0.0–0.1)
Basophils Relative: 0 %
Eosinophils Absolute: 0.1 10*3/uL (ref 0.0–0.5)
Eosinophils Relative: 1 %
HCT: 42.9 % (ref 39.0–52.0)
Hemoglobin: 14.4 g/dL (ref 13.0–17.0)
Immature Granulocytes: 0 %
Lymphocytes Relative: 33 %
Lymphs Abs: 2.2 10*3/uL (ref 0.7–4.0)
MCH: 30.1 pg (ref 26.0–34.0)
MCHC: 33.6 g/dL (ref 30.0–36.0)
MCV: 89.6 fL (ref 80.0–100.0)
Monocytes Absolute: 0.5 10*3/uL (ref 0.1–1.0)
Monocytes Relative: 7 %
Neutro Abs: 4 10*3/uL (ref 1.7–7.7)
Neutrophils Relative %: 59 %
Platelets: 274 10*3/uL (ref 150–400)
RBC: 4.79 MIL/uL (ref 4.22–5.81)
RDW: 13.2 % (ref 11.5–15.5)
WBC: 6.7 10*3/uL (ref 4.0–10.5)
nRBC: 0 % (ref 0.0–0.2)

## 2021-04-27 LAB — BASIC METABOLIC PANEL
Anion gap: 8 (ref 5–15)
BUN: 11 mg/dL (ref 6–20)
CO2: 24 mmol/L (ref 22–32)
Calcium: 8.9 mg/dL (ref 8.9–10.3)
Chloride: 106 mmol/L (ref 98–111)
Creatinine, Ser: 0.89 mg/dL (ref 0.61–1.24)
GFR, Estimated: >60 mL/min (ref >60)
Glucose, Bld: 211 mg/dL — ABNORMAL HIGH (ref 70–99)
Potassium: 3.5 mmol/L (ref 3.5–5.1)
Sodium: 138 mmol/L (ref 135–145)

## 2021-04-27 LAB — LIPID PANEL
Cholesterol: 164 mg/dL (ref 0–200)
HDL: 40 mg/dL — ABNORMAL LOW (ref >40)
LDL Cholesterol: 103 mg/dL — ABNORMAL HIGH (ref 0–99)
Total CHOL/HDL Ratio: 4.1 RATIO
Triglycerides: 106 mg/dL (ref <150)
VLDL: 21 mg/dL (ref 0–40)

## 2021-04-27 LAB — HEMOGLOBIN A1C
Hgb A1c MFr Bld: >15.5 % — ABNORMAL HIGH (ref 4.8–5.6)
Mean Plasma Glucose: >398 mg/dL

## 2021-04-27 LAB — ECHOCARDIOGRAM COMPLETE
Area-P 1/2: 5.27 cm2
Height: 70 in
S' Lateral: 7.5 cm
Weight: 1502.66 oz

## 2021-04-27 LAB — GLUCOSE, CAPILLARY
Glucose-Capillary: 280 mg/dL — ABNORMAL HIGH (ref 70–99)
Glucose-Capillary: 189 mg/dL — ABNORMAL HIGH (ref 70–99)
Glucose-Capillary: 285 mg/dL — ABNORMAL HIGH (ref 70–99)
Glucose-Capillary: 360 mg/dL — ABNORMAL HIGH (ref 70–99)

## 2021-04-27 MED ORDER — LEVETIRACETAM 750 MG PO TABS
750.0000 mg | ORAL_TABLET | Freq: Two times a day (BID) | ORAL | Status: DC
  Administered 2021-04-27 – 2021-04-28 (×2): 750 mg via ORAL
  Filled 2021-04-27 (×2): qty 1

## 2021-04-27 MED ORDER — GADOBUTROL 1 MMOL/ML IV SOLN
8.0000 mL | Freq: Once | INTRAVENOUS | Status: AC | PRN
  Administered 2021-04-27: 8 mL via INTRAVENOUS

## 2021-04-27 MED ORDER — SODIUM CHLORIDE 0.9 % IV SOLN
4000.0000 mg | INTRAVENOUS | Status: AC
  Administered 2021-04-27: 4000 mg via INTRAVENOUS
  Filled 2021-04-27: qty 40

## 2021-04-27 MED ORDER — INSULIN GLARGINE-YFGN 100 UNIT/ML ~~LOC~~ SOLN
30.0000 [IU] | Freq: Every day | SUBCUTANEOUS | Status: DC
  Filled 2021-04-27: qty 0.3

## 2021-04-27 MED ORDER — PERFLUTREN LIPID MICROSPHERE
1.0000 mL | INTRAVENOUS | Status: AC | PRN
  Administered 2021-04-27: 2 mL via INTRAVENOUS
  Filled 2021-04-27: qty 10

## 2021-04-27 MED ORDER — INSULIN GLARGINE-YFGN 100 UNIT/ML ~~LOC~~ SOLN
15.0000 [IU] | Freq: Every day | SUBCUTANEOUS | Status: DC
  Administered 2021-04-27: 15 [IU] via SUBCUTANEOUS
  Filled 2021-04-27: qty 0.15

## 2021-04-27 NOTE — Progress Notes (Signed)
Subjective: Patient states he continues to have episodes of confusion overnight.  Denies prior history of seizure.  States he had a left-sided headache which has since resolved.  Denies any neck pain.  ROS: negative except above Examination  Vital signs in last 24 hours: Temp:  [97.6 F (36.4 C)-99.5 F (37.5 C)] 98.6 F (37 C) (10/26 1114) Pulse Rate:  [72-86] 72 (10/26 1114) Resp:  [16-25] 18 (10/26 1114) BP: (97-119)/(58-87) 102/64 (10/26 1114) SpO2:  [94 %-99 %] 97 % (10/26 0739) Weight:  [42.6 kg] 42.6 kg (10/25 1700)  General: lying in bed, NAD CVS: pulse-normal rate and rhythm RS: breathing comfortably, CTAB Extremities: normal, warm  Neuro: MS: Alert, oriented, follows commands CN: pupils equal and reactive,  EOMI, face symmetric, tongue midline, normal sensation over face, Motor: 5/5 strength in all 4 extremities Reflexes: 2+ bilaterally over patella, biceps, plantars: flexor Coordination: normal Gait: not tested  Basic Metabolic Panel: Recent Labs  Lab 04/26/21 0959 04/26/21 1002 04/26/21 1009 04/26/21 1344 04/26/21 1445 04/27/21 0343  NA 133* 131* 132*  --  135 138  K 4.3 4.4 4.3  --  4.5 3.5  CL 98 96*  --   --  104 106  CO2  --  23  --   --  19* 24  GLUCOSE 636* 610*  --   --  273* 211*  BUN 12 11  --   --  10 11  CREATININE 1.00 1.17  --   --  0.91 0.89  CALCIUM  --  9.1  --   --  8.9 8.9  MG  --   --   --  2.2  --   --   PHOS  --   --   --  3.0  --   --     CBC: Recent Labs  Lab 04/26/21 0959 04/26/21 1002 04/26/21 1009 04/27/21 0343  WBC  --  6.0  --  6.7  NEUTROABS  --  4.4  --  4.0  HGB 16.7 15.6 16.7 14.4  HCT 49.0 47.0 49.0 42.9  MCV  --  90.0  --  89.6  PLT  --  286  --  274     Coagulation Studies: Recent Labs    04/26/21 1002  LABPROT 12.9  INR 1.0    Imaging  CT head without contrast 04/26/2021: Small area of hypoattenuation and loss of gray differentiation in the posterior left insula, possibly small left acute MCA  territory infarct versus artifact. ASPECTS 9. MRI could further evaluate if clinically indicated. 2. No acute hemorrhage.  CTA head and neck 04/26/2021:  No significant (greater than 50%) stenosis. 2. Patchy opacities in the visualized lung apices, potentially pneumonia or aspiration but evaluation is limited due to expiratory imaging. Recommend dedicated chest imaging.  Chest x-ray 04/26/2021: Cardiomegaly.  No focal airspace disease.  ASSESSMENT AND PLAN: 45 year old male with past medical history of diabetes type 2, combined systolic and diastolic congestive), coronary artery disease, AICD placement, CKD stage I, mixed hyperlipidemia, hypertension who was found down by EMS.  He had a 45-second seizure in route and another 2-minute seizure in the ED described as right-sided twitching and loss of consciousness.  Also reported having trouble writing with his right hand as well as episodes of staring off since morning.  Focal seizures, new onset Uncontrolled type 2 diabetes with hyperglycemia  - LTM eeg showed seizures arising from left frontal-anterior temporal region during which patient either had no clinical signs or had  trouble speaking.  There was average 1 seizure per hour, lasting about 1 to 2 minutes each.  Recommendations -Will load with IV Keppra 4000 mg once -We will start Keppra 750 mg twice daily -New onset seizures could be provoked due to hyperglycemia. We will also obtain MRI brain with and without contrast to look for any acute abnormality causing focal seizures.  Does have AICD which is MRI compatible -Continue LTM EEG until patient is seizure-free for about 24 hours -If patient continues to have further seizures, will load with Vimpat -management of rest of comorbidities per primary team  I have spent a total of 45 minutes with the patient reviewing hospital notes,  test results, labs and examining the patient as well as establishing an assessment and plan that was  discussed personally with the patient.  > 50% of time was spent in direct patient care.   Zeb Comfort Epilepsy [facility] For questions after 5pm please refer to AMION to reach the Neurologist on call

## 2021-04-27 NOTE — Procedures (Addendum)
Patient Name: Jeremy Burns  MRN: [id number]  Epilepsy Attending: Lora Havens  Referring Physician/Provider: Dr Donnetta Simpers Duration: 04/26/2021 1406 to 1026/2022 1417   Patient history: 45 year old male with 2 minutes of seizure activity described as right-sided twitching with right gaze deviation followed by right-sided weakness.  EEG to evaluate for seizures.   Level of alertness: Awake, asleep   AEDs during EEG study: LEV   Technical aspects: This EEG study was done with scalp electrodes positioned according to the 10-20 International system of electrode placement. Electrical activity was acquired at a sampling rate of 500Hz  and reviewed with a high frequency filter of 70Hz  and a low frequency filter of 1Hz . EEG data were recorded continuously and digitally stored.    Description: The posterior dominant rhythm consists of 9 Hz activity of moderate voltage (25-35 uV) seen predominantly in posterior head regions, symmetric and reactive to eye opening and eye closing.  Sleep was characterized by meticulous, sleep spindles (12 to 14 Hz), maximal frontocentral region.  Seizures were noted to be arising from left frontal-anterior temporal region.  During most of the seizures patient was asleep and no clinical signs were noted.  However, during the seizure at [place] AM on 04/27/2021 patient was working with a therapist and suddenly was noted to have trouble speaking, kept repeating " he said umm", was able to follow simple commands like sitting down on bed.  During all the seizures, EEG initially showed sharply contoured 4 to 5 Hz theta slowing in left frontal-anterior temporal region which then spread to all of left hemisphere followed by right hemisphere and evolved into 2 to 3 Hz high amplitude spike and wave activity.  There was average 1 seizure per hour, lasting about 1 to 2 minutes each.  Last seizure was noted 04/27/2021 at 1132  ABNORMALITY -Focal seizure, left frontal-anterior  temporal region  IMPRESSION: This study showed seizures arising from left frontal-anterior temporal region during which patient either had no clinical signs or had trouble speaking.  There was average 1 seizure per hour, lasting about 1 to 2 minutes each.  Last seizure was on 04/27/2021 at 1132.   Shantale Holtmeyer Barbra Sarks

## 2021-04-27 NOTE — Progress Notes (Signed)
Removed EEG leads for MRI.  [facility] notified

## 2021-04-27 NOTE — Evaluation (Signed)
Speech Language Pathology Evaluation Patient Details Name: Jeremy Burns MRN: [id number] DOB: 1976-05-14 Today's Date: 04/27/2021 Time: 1015-1100 SLP Time Calculation (min) (ACUTE ONLY): 45 min  Problem List:  Patient Active Problem List   Diagnosis Date Noted   Seizure ([place]) 04/26/2021   Past Medical History: No past medical history on file. Past Surgical History: The histories are not reviewed yet. Please review them in the "History" navigator section and refresh this Cave. HPI:  45yo male admitted 04/26/21, found down in his tractor trailer. 45sec episode of sz activity. PMH: DM2, cardiomyopathy/chronic stystolic CHF, AICD, CKD2, HTN, HLD, Paroxysmal VT. MRI pending   Assessment / Plan / Recommendation Clinical Impression  Pt seen at bedside for assessment of cognition, language, and speech production. Pt was awake, alert, and cooperative with unfamiliar therapist. He is very talkative and self distracting. Speech is intact - no dysarthria noted. Receptive and Expressive Language are also intact. Pt is able to follow multistep directions, answer questions, and engage in conversation without apparent word retrievel deficits. Cognitively, pt presents with mild higher level cognitive deficits. He is Ox4 (except day of the week). His immediate and delayed recall is intact, however, he reports difficulty with his memory. Pt exhibited difficulty with serial 7s, but was accurate with a functional mental math task. He struggled with verbal fluency/thought organization task of naming category members. His visuospatial skills appear intact. Pt exhibits difficulty with functional problem solving, reasoning, and awareness of deficits, as he is unable (without cues) to verbalize what he would do to safely get up to the bathroom. 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 ST will continue to follow pt acutely, but will benefit from  continued ST services after DC to maximize safety and independence.    SLP Assessment  SLP Recommendation/Assessment: Patient needs continued Speech Language Pathology Services  SLP Visit Diagnosis: Cognitive communication deficit (R41.841)    Recommendations for follow up therapy are one component of a multi-disciplinary discharge planning process, led by the attending physician.  Recommendations may be updated based on patient status, additional functional criteria and insurance authorization.    Follow Up Recommendations  continued ST intervention at next level of care   Frequency and Duration min 1 x/week  2 weeks      SLP Evaluation Cognition  Overall Cognitive Status: Impaired/Different from baseline Arousal/Alertness: Awake/alert Orientation Level: Oriented X4 Year: 2022 Month: October Day of Week: Incorrect Memory: Appears intact Awareness: Impaired Problem Solving: Impaired Problem Solving Impairment: Verbal basic Executive Function: Reasoning;Organizing;Decision Making Reasoning: Impaired Reasoning Impairment: Verbal basic Organizing: Impaired Organizing Impairment: Verbal basic Decision Making: Impaired Decision Making Impairment: Verbal basic Safety/Judgment: Impaired       Comprehension  Auditory Comprehension Overall Auditory Comprehension: Appears within functional limits for tasks assessed Visual Recognition/Discrimination Discrimination: Within Function Limits Reading Comprehension Reading Status: Within funtional limits    Expression Expression Primary Mode of Expression: Verbal Verbal Expression Overall Verbal Expression: Appears within functional limits for tasks assessed Written Expression Dominant Hand: Right   Oral / Motor  Oral Motor/Sensory Function Overall Oral Motor/Sensory Function: Within functional limits Motor Speech Overall Motor Speech: Appears within functional limits for tasks assessed Intelligibility: Intelligible    GO                   Jeremy Burns B. Jeremy Burns, [facility], [place] Speech Language Pathologist Office: [phone]  Burns, Jeremy 04/27/2021, 11:48 AM

## 2021-04-27 NOTE — Progress Notes (Signed)
Changed device settings for MRI to  ODO  MRI mode/Tachy-therapies to off  Will Program device back to pre-MRI settings after completion of exam, and send transmission.

## 2021-04-27 NOTE — Evaluation (Signed)
Occupational Therapy Evaluation Patient Details Name: Jeremy Burns MRN: [id number] DOB: 1976-02-01 Today's Date: 04/27/2021   History of Present Illness 45 year old male who presented to [facility] after found down in his tractor-trailer and EMS witnessed a 45-second episode of seizure activity with bilateral upper extremity general tonic-clonic behavior, resolved after abotu 2 minutes followed by inability to raise his right arm. PMHx: none on file   Clinical Impression   Lucky was indep PTA, he works part-time as a Conservation officer, historic buildings. He lives in a 1 level home with 3-5 STE with his 74 year old daughter and a "male friend" who can assist with all IADLs. Upon evaluation pt required close min guard for ADLs and mobility without AD for safety only due to seizure precautions and related R sided weakness. Pt's ROM and MMT are [facility] and equal on both side. Pt is Ox4 however pt unable to attend to more than one task at a time, easily distracted by conversation - during upper body bathing task pt conversing about his Uncle and then froze with both talking and bathing and required verbal cues to return to bathing and reported "I forget" when asked about his Uncle. Pt reported concern with his thinking skills and getting his words out. Pt will benefit from continued OT. Recommend d/c to home with OP neuro follow up.      Recommendations for follow up therapy are one component of a multi-disciplinary discharge planning process, led by the attending physician.  Recommendations may be updated based on patient status, additional functional criteria and insurance authorization.   Follow Up Recommendations  Outpatient OT ([facility] for cognition pending pt progress acutely)    Assistance Recommended at Discharge Intermittent Supervision/Assistance  Functional Status Assessment  Patient has had a recent decline in their functional status and demonstrates the ability to make significant improvements in function in a  reasonable and predictable amount of time.  Equipment Recommendations  None recommended by OT    Recommendations for Other Services       Precautions / Restrictions Precautions Precautions:  (seizure) Precaution Comments: seizure Restrictions Weight Bearing Restrictions: No      Mobility Bed Mobility Overal bed mobility: Modified Independent             General bed mobility comments: HOB slightly elevated & use of rail    Transfers Overall transfer level: Needs assistance Equipment used: None Transfers: Sit to/from Stand Sit to Stand: Min guard           General transfer comment: for safety      Balance Overall balance assessment: Needs assistance Sitting-balance support: Feet supported Sitting balance-Leahy Scale: Normal     Standing balance support: No upper extremity supported;During functional activity Standing balance-Leahy Scale: Fair                             ADL either performed or assessed with clinical judgement   ADL Overall ADL's : Needs assistance/impaired Eating/Feeding: Independent;Sitting   Grooming: Min guard;Standing   Upper Body Bathing: Min guard;Sitting   Lower Body Bathing: Min guard;Sit to/from stand   Upper Body Dressing : Min guard;Sitting   Lower Body Dressing: Min guard;Sit to/from stand   Toilet Transfer: Min guard;Ambulation   Toileting- Clothing Manipulation and Hygiene: Min guard       Functional mobility during ADLs: Min guard General ADL Comments: min guard or all for safety due to report of R side weakness with seizure activity.  No LOB or R weakness this session.     Vision Baseline Vision/History: 0 No visual deficits Ability to See in Adequate Light: 0 Adequate Patient Visual Report: No change from baseline       Perception     Praxis      Pertinent Vitals/Pain Pain Assessment: No/denies pain     Hand Dominance Right   Extremity/Trunk Assessment Upper Extremity  Assessment Upper Extremity Assessment: Overall WFL for tasks assessed   Lower Extremity Assessment Lower Extremity Assessment: Overall WFL for tasks assessed   Cervical / Trunk Assessment Cervical / Trunk Assessment: Normal   Communication Communication Communication: No difficulties   Cognition Arousal/Alertness: Awake/alert Behavior During Therapy: WFL for tasks assessed/performed Overall Cognitive Status: Impaired/Different from baseline Area of Impairment: Attention;Memory;Following commands;Safety/judgement;Problem solving                   Current Attention Level: Selective Memory: Decreased short-term memory Following Commands: Follows one step commands consistently;Follows one step commands with increased time Safety/Judgement: Decreased awareness of safety;Decreased awareness of deficits   Problem Solving: Slow processing;Requires verbal cues General Comments: pt is Ox4, unable to multitask such as hold conversation while sponge bathing, often trails of in conversation and after verbal cue back to what he was saying he would state "i forget" - pt's report of his main concern are related to thinking skills, being forgetful and not remeber what he is trying to communicate     General Comments  VSS on RA, continuous EEG - during bathing task, pt talking about his uncle, started laughing and then seemingly frooze and required cues to sustain bathing tasks and when asked what he was trying to say about his uncle he said "i cant remember"    Exercises     Shoulder Instructions      Home Living Family/patient expects to be discharged to:: Private residence Living Arrangements: Children;Non-relatives/Friends (male "friend") Available Help at Discharge: Available PRN/intermittently Type of Home: House Home Access: Stairs to enter [employer] of Steps: 5 at one entrance, 3 at the other   Home Layout: One level     Bathroom Shower/Tub: Medical illustrator: Standard     Home Equipment: None          Prior Functioning/Environment Prior Level of Function : Independent/Modified Independent;Working/employed             Mobility Comments: no AD ADLs Comments: indep, 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 List: Decreased activity tolerance;Impaired balance (sitting and/or standing);Decreased cognition;Decreased safety awareness;Decreased knowledge of precautions      OT Treatment/Interventions: Self-care/ADL training;DME and/or AE instruction;Therapeutic activities;Cognitive remediation/compensation;Patient/family education    OT Goals(Current goals can be found in the care plan section) Acute Rehab OT Goals Patient Stated Goal: no more seizures OT Goal Formulation: With patient Time For Goal Achievement: 05/11/21 Potential to Achieve Goals: Fair ADL Goals Pt Will Perform Grooming: Independently;standing Pt Will Transfer to Toilet: Independently;ambulating Additional ADL Goal #1: Pt will demonstrated ability to sustain divided attention during ADL tasks with minimal verbal cues Additional ADL Goal #2: Pt will indep demonstrated at least one cognitive compensatory technique to promote accuracy in ADL/IADLs  OT Frequency: Min 2X/week   Barriers to D/C: Decreased caregiver support  no on ehome with pt besides his 76 yo daughter sometimes       Co-evaluation              AM-PAC  OT "6 Clicks" Daily Activity     Outcome Measure Help from another person eating meals?: None Help from another person taking care of personal grooming?: A Little Help from another person toileting, which includes using toliet, bedpan, or urinal?: A Little Help from another person bathing (including washing, rinsing, drying)?: A Little Help from another person to put on and taking off regular upper body clothing?: None Help from another person to put on and taking off regular lower body clothing?: A Little 6 Click  Score: 20   End of Session Nurse Communication: Mobility status (pt "freezing" during task)  Activity Tolerance: Patient tolerated treatment well Patient left: in bed;with bed alarm set;with call bell/phone within reach  OT Visit Diagnosis: Muscle weakness (generalized) (M62.81);Other symptoms and signs involving cognitive function                Time: 6701-4103 OT Time Calculation (min): 21 min Charges:  OT General Charges $OT Visit: 1 Visit OT Evaluation $OT Eval Moderate Complexity: 1 Mod   Adaline Trejos A Lanaiya Lantry 04/27/2021, 10:20 AM

## 2021-04-27 NOTE — Progress Notes (Signed)
PT Cancellation Note  Patient Details Name: Jeremy Burns MRN: [id number] DOB: February 03, 1976   Cancelled Treatment:    Reason Eval/Treat Not Completed: Patient currently off unit for MRI. Will check back as schedule allows to initiate PT eval.    Thelma Comp 04/27/2021, 2:56 PM  Rolinda Roan, PT, DPT Acute Rehabilitation Services Pager: [phone] Office: [phone]

## 2021-04-27 NOTE — Progress Notes (Signed)
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 -In the emergency room he was noted to have CBG greater than 600 without DKA -Seen by neurology, loaded with Keppra, EEG ordered   Assessment & Plan:  New onset seizure -EEG abnormal noted epileptiform activity arising from left frontal anterior temporal region -Neurology following, loading with Keppra again, followed by 750 mg twice daily -Possibly provoked by hyperglycemia -Plan for MRI brain today -On long-term EEG at this time  Hyperosmolar nonketotic hyperglycemia -CBG> 600 on admission, without DKA, patient does not remember missing any insulin doses -At baseline on Levemir 75 units twice daily and NovoLog 3 times daily with meals, will increase Semglee to 30 units daily -Follow-up hemoglobin A1c -Metformin on hold  History of CAD History of chronic systolic and diastolic CHF with AICD -EF unknown -Clinically euvolemic at this time, continue carvedilol, amiodarone, statin -Hold Lasix and Aldactone today  Hypertension -BP soft, hold Cozaar  DVT prophylaxis: Lovenox Code Status: Full code Family Communication: Discussed with patient in detail, no family at bedside Disposition Plan:  Status is: Inpatient  Remains inpatient appropriate because: Severity of illness   Consultants:  Neurology  Procedures:   Antimicrobials:    Subjective: -Feels okay today, a little groggy  Objective: Vitals:   04/27/21 0355 04/27/21 0739 04/27/21 1114 04/27/21 1513  BP: 97/70 105/68 102/64   Pulse: 72 75 72    Resp: _0 Temp: 97.6 F (36.4 C) 98 F (36.7 C) 98.6 F (37 C)   TempSrc:   Oral   SpO2: 94% 97%    Weight:    98.1 kg  Height:        Intake/Output Summary (Last 24 hours) at 04/27/2021 1522 Last data filed at 04/27/2021 1512 Gross per 24 hour  Intake 2212 ml  Output 1100 ml  Net 1112 ml   Filed Weights   04/26/21 1700 04/27/21 1513  Weight: 42.6 kg 98.1 kg    Examination:  General exam: Average built male sitting up in bed, AAOx3, no distress, EEG leads on Respiratory system: Clear to auscultation Cardiovascular system: S1 & S2 heard, RRR.  Abd: nondistended, soft and nontender.Normal bowel sounds heard. Central nervous system: Alert and oriented. No focal neurological deficits. Extremities: no edema Skin: No rashes Psychiatry: Mood & affect appropriate.     Data Reviewed:   CBC: Recent Labs  Lab 04/26/21 0959 04/26/21 1002 04/26/21 1009 04/27/21 0343  WBC  --  6.0  --  6.7  NEUTROABS  --  4.4  --  4.0  HGB 16.7 15.6 16.7 14.4  HCT 49.0 47.0 49.0 42.9  MCV  --  90.0  --  89.6  PLT  --  286  --  355   Basic Metabolic Panel: Recent Labs  Lab 04/26/21 0959 04/26/21 1002 04/26/21 1009 04/26/21 1344 04/26/21 1445 04/27/21 0343  NA 133* 131* 132*  --  135 138  K 4.3 4.4 4.3  --  4.5 3.5  CL 98 96*  --   --  104 106  CO2  --  23  --   --  19* 24  GLUCOSE 636* 610*  --   --  273* 211*  BUN 12 11  --   --  10 11  CREATININE 1.00 1.17  --   --  0.91 0.89  CALCIUM  --  9.1  --   --  8.9 8.9  MG  --   --   --  2.2  --   --   PHOS  --   --   --  3.0  --   --    GFR: Estimated Creatinine Clearance: 124.3 mL/min (by C-G formula based on SCr of 0.89 mg/dL). Liver Function Tests: Recent Labs  Lab 04/26/21 1002  AST 19  ALT 12  ALKPHOS 84  BILITOT 0.8  PROT 7.4  ALBUMIN 3.6   No results for input(s): LIPASE, AMYLASE in the last 168 hours. No results for input(s): AMMONIA in the last 168 hours. Coagulation Profile: Recent Labs  Lab  04/26/21 1002  INR 1.0   Cardiac Enzymes: No results for input(s): CKTOTAL, CKMB, CKMBINDEX, TROPONINI in the last 168 hours. BNP (last 3 results) No results for input(s): PROBNP in the last 8760 hours. HbA1C: Recent Labs    04/26/21 1002  HGBA1C >15.5*   CBG: Recent Labs  Lab 04/26/21 1329 04/26/21 1755 04/26/21 2155 04/27/21 0626 04/27/21 1113  GLUCAP 391* 197* 338* 280* 189*   Lipid Profile: Recent Labs    04/27/21 0343  CHOL 164  HDL 40*  LDLCALC 103*  TRIG 106  CHOLHDL 4.1   Thyroid Function Tests: No results for input(s): TSH, T4TOTAL, FREET4, T3FREE, THYROIDAB in the last 72 hours. Anemia Panel: No results for input(s): VITAMINB12, FOLATE, FERRITIN, TIBC, IRON, RETICCTPCT in the last 72 hours. Urine analysis:    Component Value Date/Time   COLORURINE STRAW (A) 04/26/2021 1030   APPEARANCEUR CLEAR 04/26/2021 1030   LABSPEC 1.026 04/26/2021 1030   PHURINE 6.0 04/26/2021 1030   GLUCOSEU >=500 (A) 04/26/2021 1030   HGBUR NEGATIVE 04/26/2021 1030   [place] 04/26/2021 1030   KETONESUR NEGATIVE 04/26/2021 1030   PROTEINUR 100 (A) 04/26/2021 1030   NITRITE NEGATIVE 04/26/2021 1030   LEUKOCYTESUR NEGATIVE 04/26/2021 1030   Sepsis Labs: _0 (procalcitonin:4,lacticidven:4)  )No results found for this or any previous visit (from the past 240 hour(s)).       Radiology Studies: DG Chest Portable 1 View  Result Date: 04/26/2021 CLINICAL DATA:  Altered mental status, seizure EXAM: PORTABLE CHEST 1 VIEW COMPARISON:  None. FINDINGS: Dual lead left-sided implanted cardiac device. Cardiomegaly. No focal airspace consolidation, pleural effusion, or pneumothorax. IMPRESSION: Cardiomegaly.  No focal airspace disease. Electronically Signed   By: Davina Poke D.O.   On: 04/26/2021 11:34   EEG adult  Result Date: 04/26/2021 Lora Havens, MD     04/26/2021  2:41 PM Patient Name: Jeremy Burns MRN: [id number] Epilepsy Attending: Lora Havens Referring Physician/Provider: Dr Donnetta Simpers Date: 04/26/2021 Duration: 25.05 mins Patient history: 45 year old male with 2 minutes of seizure activity described as right-sided twitching with right gaze deviation followed by right-sided weakness.  EEG to evaluate for seizures. Level of alertness: Awake, asleep AEDs during EEG study: LEV Technical aspects: This EEG study was done with scalp electrodes positioned according to the 10-20 International system of electrode placement. Electrical activity was acquired at a sampling rate of _1  and reviewed with a high frequency filter of _2  and a low frequency filter of _3 . EEG data were recorded continuously and digitally stored. Description: The posterior dominant rhythm  consists of 9 Hz activity of moderate voltage (25-35 uV) seen predominantly in posterior head regions, symmetric and reactive to eye opening and eye closing. Physiologic photic driving was not seen during photic stimulation.  Hyperventilation was not performed.   IMPRESSION: This study is within normal limits. No seizures or epileptiform discharges were seen throughout the recording. Priyanka Barbra Sarks   Overnight EEG with video  Result Date: 04/27/2021 Lora Havens, MD     04/27/2021 10:41 AM Patient Name: Jeremy Burns MRN: [id number] Epilepsy Attending: Lora Havens Referring Physician/Provider: Dr Donnetta Simpers Duration: 04/26/2021 1406 to 1026/2022 1000  Patient history: 45 year old male with 2 minutes of seizure activity described as right-sided twitching with right gaze deviation followed by right-sided weakness.  EEG to evaluate for seizures.  Level of alertness: Awake, asleep  AEDs during EEG study: LEV  Technical aspects: This EEG study was done with scalp electrodes positioned according to the 10-20 International system of electrode placement. Electrical activity was acquired at a sampling rate of _0  and reviewed with a high frequency filter of _1  and a low  frequency filter of _2 . EEG data were recorded continuously and digitally stored.  Description: The posterior dominant rhythm consists of 9 Hz activity of moderate voltage (25-35 uV) seen predominantly in posterior head regions, symmetric and reactive to eye opening and eye closing.  Sleep was characterized by meticulous, sleep spindles (12 to 14 Hz), maximal frontocentral region. Seizures were noted to be arising from left frontal-anterior temporal region.  During most of the seizures patient was asleep and no clinical signs were noted.  However, during the seizure at [place] AM on 04/27/2021 patient was working with a therapist and suddenly was noted to have trouble speaking, kept repeating " he said umm", was able to follow simple commands like sitting down on bed.  During all the seizures, EEG initially showed sharply contoured 4 to 5 Hz theta slowing in left frontal-anterior temporal region which then spread to all of left hemisphere followed by right hemisphere and evolved into 2 to 3 Hz high amplitude spike and wave activity.  There was average 1 seizure per hour, lasting about 1 to 2 minutes each. ABNORMALITY -Focal seizure, left frontal-anterior temporal region IMPRESSION: This study showed seizures arising from left frontal-anterior temporal region during which patient either had no clinical signs or had trouble speaking.  There was average 1 seizure per hour, lasting about 1 to 2 minutes each. Lora Havens   CT HEAD CODE STROKE WO CONTRAST  Result Date: 04/26/2021 CLINICAL DATA:  Code stroke.  Neuro deficit, acute, stroke suspected EXAM: CT HEAD WITHOUT CONTRAST TECHNIQUE: Contiguous axial images were obtained from the base of the skull through the vertex without intravenous contrast. COMPARISON:  None. FINDINGS: Brain: Small area of hypoattenuation and loss of gray differentiation in the posterior left insula (series 3, image 16; series 6, image 39). No acute hemorrhage. No mass lesion, midline  shift, extra-axial fluid collection, or hydrocephalus. Vascular: No hyperdense vessel identified. Skull: No acute fracture. Sinuses/Orbits: No substantial paranasal sinus disease. Unremarkable orbits. Other: No mastoid effusions. ASPECTS [facility] Stroke Program Early CT Score) - Ganglionic level infarction (caudate, lentiform nuclei, internal capsule, insula, M1-M3 cortex): 6 - Supraganglionic infarction (M4-M6 cortex): 3 Total score (0-10 with 10 being normal): 9 IMPRESSION: 1. Small area of hypoattenuation and loss of gray differentiation in the posterior left insula, possibly small left acute MCA territory infarct versus artifact. ASPECTS 9. MRI could further evaluate if clinically indicated. 2.  No acute hemorrhage. Code stroke imaging results were communicated on 04/26/2021 at 10:05 am to provider Dr. Lorrin Goodell via telephone, who verbally acknowledged these results. Electronically Signed   By: Margaretha Sheffield M.D.   On: 04/26/2021 10:10   CT ANGIO HEAD NECK W WO CM (CODE STROKE)  Result Date: 04/26/2021 CLINICAL DATA:  Stroke/TIA, assess intracranial arteries EXAM: CT ANGIOGRAPHY HEAD AND NECK TECHNIQUE: Multidetector CT imaging of the head and neck was performed using the standard protocol during bolus administration of intravenous contrast. Multiplanar CT image reconstructions and MIPs were obtained to evaluate the vascular anatomy. Carotid stenosis measurements (when applicable) are obtained utilizing NASCET criteria, using the distal internal carotid diameter as the denominator. CONTRAST:  35m OMNIPAQUE IOHEXOL 350 MG/ML SOLN COMPARISON:  None. FINDINGS: CTA NECK FINDINGS Aortic arch: Great vessel origins are patent. Right carotid system: No evidence of dissection, stenosis (50% or greater) or occlusion. Left carotid system: No evidence of dissection, stenosis (50% or greater) or occlusion. Vertebral arteries: Left dominant. No evidence of dissection, stenosis (50% or greater) or occlusion.  Skeleton: No evidence of acute abnormality. Other neck: No acute abnormality. Upper chest: Patchy opacities in the visualized lung apices Review of the MIP images confirms the above findings CTA HEAD FINDINGS Evaluation limited by venous contamination.  Within this limitation: Anterior circulation: Bilateral intracranial ICAs, MCAs, and ACAs are patent without evidence of proximal hemodynamically significant stenosis. Limited evaluation the distal MCA vessels due to venous contamination. Posterior circulation: Small/non dominant right vertebral artery. Bilateral intradural vertebral arteries, basilar artery, and posterior cerebral arteries are patent. Mild stenosis of the distal left intradural vertebral artery. Mild multifocal narrowing of bilateral PCAs. No aneurysm identified. Venous sinuses: No evidence of dural venous sinus thrombosis. Review of the MIP images confirms the above findings IMPRESSION: CTA head: Limited study due to venous contamination. No evidence of large vessel occlusion or proximal hemodynamically significant stenosis. CTA neck: 1. No significant (greater than 50%) stenosis. 2. Patchy opacities in the visualized lung apices, potentially pneumonia or aspiration but evaluation is limited due to expiratory imaging. Recommend dedicated chest imaging. Urgent findings were communicated on 04/26/2021 at 10:21 am to provider Dr. KLorrin Goodellvia telephone, who verbally acknowledged these results. Electronically Signed   By: FMargaretha SheffieldM.D.   On: 04/26/2021 10:48        Scheduled Meds:   stroke: mapping our early stages of recovery book   Does not apply Once   amiodarone  200 mg Oral Daily   aspirin EC  81 mg Oral Daily   atorvastatin  80 mg Oral Daily   carvedilol  25 mg Oral BID WC   chlorhexidine gluconate (MEDLINE KIT)  15 mL Mouth Rinse BID   enoxaparin (LOVENOX) injection  40 mg Subcutaneous Q24H   insulin aspart  0-15 Units Subcutaneous TID WC   insulin aspart  0-5 Units  Subcutaneous QHS   insulin glargine-yfgn  15 Units Subcutaneous Daily   mouth rinse  15 mL Mouth Rinse 10 times per day   Continuous Infusions:   LOS: 1 day    Time spent: 377m    PrDomenic PoliteMD [facility]   04/27/2021, 3:22 PM

## 2021-04-27 NOTE — Progress Notes (Addendum)
Inpatient Diabetes Program Recommendations  AACE/ADA: New Consensus Statement on Inpatient Glycemic Control (2015)  Target Ranges:  Prepandial:   less than 140 mg/dL      Peak postprandial:   less than 180 mg/dL (1-2 hours)      Critically ill patients:  140 - 180 mg/dL   Lab Results  Component Value Date   GLUCAP 189 (H) 04/27/2021   HGBA1C >15.5 (H) 04/26/2021    Review of Glycemic Control Results for Jeremy Burns, Jeremy Burns (MRN [id number]) as of 04/27/2021 12:48  Ref. Range 04/26/2021 13:29 04/26/2021 17:55 04/26/2021 21:55 04/27/2021 06:26 04/27/2021 11:13  Glucose-Capillary Latest Ref Range: 70 - 99 mg/dL 391 (H) 197 (H) 338 (H) 280 (H) 189 (H)   Diabetes history: DM 2 Outpatient Diabetes medications:  Novolin R 10 units tid with meals Levemir 55 units bid Metformin XR- 500 mg bid Current orders for Inpatient glycemic control:  Novolog moderate tid with meals and HS Semglee 15 units daily  Inpatient Diabetes Program Recommendations:    May consider increasing Semglee to 30 units daily.   Will talk with patient regarding A1C.   Thanks,  Adah Perl, RN, BC-ADM Inpatient Diabetes Coordinator Pager [phone]  (8a-5p)  Addendum [phone]- Spoke with patient at bedside regarding elevated A1C of >15.5%.  He states that he was taking his insulin as ordered but was not checking his blood sugars.  He states that in the past, even the hospital has struggled to get his blood sugars under control.  Discussed goal blood sugars and A1C.  ? If patient was taking insulin based on A1C.  Discussed injection sites and rotation as well.  Told patient that he needs to check blood sugars since he is on insulin.  He also needs very close follow up with PCP.  Even asked if he is keeping insulin cool when working, and he states that he is??  Not sure why blood sugars are so uncontrolled.  Will follow.

## 2021-04-28 LAB — BASIC METABOLIC PANEL
Anion gap: 6 (ref 5–15)
BUN: 11 mg/dL (ref 6–20)
CO2: 22 mmol/L (ref 22–32)
Calcium: 8.5 mg/dL — ABNORMAL LOW (ref 8.9–10.3)
Chloride: 105 mmol/L (ref 98–111)
Creatinine, Ser: 0.98 mg/dL (ref 0.61–1.24)
GFR, Estimated: >60 mL/min (ref >60)
Glucose, Bld: 283 mg/dL — ABNORMAL HIGH (ref 70–99)
Potassium: 3.4 mmol/L — ABNORMAL LOW (ref 3.5–5.1)
Sodium: 133 mmol/L — ABNORMAL LOW (ref 135–145)

## 2021-04-28 LAB — CBC
HCT: 43 % (ref 39.0–52.0)
Hemoglobin: 14 g/dL (ref 13.0–17.0)
MCH: 29.7 pg (ref 26.0–34.0)
MCHC: 32.6 g/dL (ref 30.0–36.0)
MCV: 91.1 fL (ref 80.0–100.0)
Platelets: 239 10*3/uL (ref 150–400)
RBC: 4.72 MIL/uL (ref 4.22–5.81)
RDW: 13.2 % (ref 11.5–15.5)
WBC: 7.4 10*3/uL (ref 4.0–10.5)
nRBC: 0 % (ref 0.0–0.2)

## 2021-04-28 LAB — PROTIME-INR
INR: 1 (ref 0.8–1.2)
Prothrombin Time: 12.7 seconds (ref 11.4–15.2)

## 2021-04-28 LAB — GLUCOSE, CAPILLARY
Glucose-Capillary: 261 mg/dL — ABNORMAL HIGH (ref 70–99)
Glucose-Capillary: 257 mg/dL — ABNORMAL HIGH (ref 70–99)
Glucose-Capillary: 352 mg/dL — ABNORMAL HIGH (ref 70–99)

## 2021-04-28 LAB — APTT: aPTT: 27 seconds (ref 24–36)

## 2021-04-28 MED ORDER — LEVETIRACETAM 250 MG PO TABS
250.0000 mg | ORAL_TABLET | Freq: Once | ORAL | Status: AC
  Administered 2021-04-28: 250 mg via ORAL
  Filled 2021-04-28: qty 1

## 2021-04-28 MED ORDER — SODIUM CHLORIDE 0.9 % IV SOLN: INTRAVENOUS | Status: DC

## 2021-04-28 MED ORDER — LEVETIRACETAM 500 MG PO TABS
1000.0000 mg | ORAL_TABLET | Freq: Two times a day (BID) | ORAL | Status: DC
  Administered 2021-04-28: 1000 mg via ORAL
  Filled 2021-04-28: qty 2

## 2021-04-28 MED ORDER — INSULIN GLARGINE-YFGN 100 UNIT/ML ~~LOC~~ SOLN
40.0000 [IU] | Freq: Every day | SUBCUTANEOUS | Status: DC
  Administered 2021-04-28: 40 [IU] via SUBCUTANEOUS
  Filled 2021-04-28 (×2): qty 0.4

## 2021-04-28 NOTE — Significant Event (Signed)
As NP positioning patient for LP, he began to stutter and have difficulty getting out his words. He was awake. Answered questions and was aware, but just difficulty speaking. This lasted 1 min and patient was fully awake, alert, and oriented afterwards and able to participate in LP.  No tremors, shaking, jerking, eye fluttering or staring noted.   Clance Boll, MSN, APN-BC Neurology Nurse Practitioner Pager [phone]

## 2021-04-28 NOTE — Progress Notes (Signed)
Subjective: States he is feeling well. Does report feeling "fuzzy" once.   ROS: negative except above  Examination  Vital signs in last 24 hours: Temp:  [98 F (36.7 C)-98.6 F (37 C)] 98.6 F (37 C) (10/27 1244) Pulse Rate:  [68-77] 71 (10/27 1244) Resp:  [16-18] 18 (10/27 1244) BP: (96-119)/(71-77) 96/74 (10/27 1244) SpO2:  [94 %-98 %] 98 % (10/27 1244)  General: lying in bed, NAD CVS: pulse-normal rate and rhythm RS: breathing comfortably, CTAB Extremities: normal, warm   Neuro: MS: Alert, oriented, follows commands CN: pupils equal and reactive,  EOMI, face symmetric, tongue midline, normal sensation over face, Motor: 5/5 strength in all 4 extremities Reflexes: 2+ bilaterally over patella, biceps, plantars: flexor Coordination: normal Gait: not tested  Basic Metabolic Panel: Recent Labs  Lab 04/26/21 0959 04/26/21 0959 04/26/21 1002 04/26/21 1009 04/26/21 1344 04/26/21 1445 04/27/21 0343 04/28/21 0245  NA 133*  --  131* 132*  --  135 138 133*  K 4.3  --  4.4 4.3  --  4.5 3.5 3.4*  CL 98  --  96*  --   --  104 106 105  CO2  --   --  23  --   --  19* 24 22  GLUCOSE 636*  --  610*  --   --  273* 211* 283*  BUN 12  --  11  --   --  10 11 11   CREATININE 1.00  --  1.17  --   --  0.91 0.89 0.98  CALCIUM  --    < > 9.1  --   --  8.9 8.9 8.5*  MG  --   --   --   --  2.2  --   --   --   PHOS  --   --   --   --  3.0  --   --   --    < > = values in this interval not displayed.    CBC: Recent Labs  Lab 04/26/21 0959 04/26/21 1002 04/26/21 1009 04/27/21 0343 04/28/21 0245  WBC  --  6.0  --  6.7 7.4  NEUTROABS  --  4.4  --  4.0  --   HGB 16.7 15.6 16.7 14.4 14.0  HCT 49.0 47.0 49.0 42.9 43.0  MCV  --  90.0  --  89.6 91.1  PLT  --  286  --  274 239     Coagulation Studies: Recent Labs    04/26/21 1002 04/28/21 1025  LABPROT 12.9 12.7  INR 1.0 1.0    Imaging MR Brain w and wo contrast 04/27/2021:  Multiple enhancing parenchymal and leptomeningeal  lesion in the left frontoparietal and insular region, nonspecific. Differential diagnosis include leptomeningeal processes such as infection and granulomatous disease versus malignancy. No significant mass effect or hemorrhage.     ASSESSMENT AND PLAN: 45 year old male with past medical history of diabetes type 2, combined systolic and diastolic congestive), coronary artery disease, AICD placement, CKD stage I, mixed hyperlipidemia, hypertension who was found down by EMS.  He had a 45-second seizure in route and another 2-minute seizure in the ED described as right-sided twitching and loss of consciousness.  Also reported having trouble writing with his right hand as well as episodes of staring off since morning.   Focal seizures, new onset Brain lesions -This study showed three seizures arising from left frontal-anterior temporal region during which patient  had no clinical signs. The seizures were noted  on 04/27/2021 at 2005 and 2122, lasting about 3 minutes and on 04/28/2021 at 0837 lasting about 7 minutes.    Recommendations - As patient had 3 more seizures overnight, will increase Keppra to 1000 mg twice daily - Will obtain CT chest, abod and pelvis w contrast for malignancy workup - Plan for Lumbar puncture to look for infection, cytology. This doesn't look typical for bacterial and viral meningitis so not starting broad spectrum antibiotics and antiviral yet.  - Will consider ID consult depending on LP results - Continue LTM EEG until patient is seizure-free for about 24 hours -If patient continues to have further seizures, will load with Vimpat -management of rest of comorbidities per primary team   I have spent a total of 35 minutes with the patient reviewing hospital notes,  test results, labs and examining the patient as well as establishing an assessment and plan that was discussed personally with the patient and Dr Broadus John.  > 50% of time was spent in direct patient care.    Zeb Comfort Epilepsy [facility] For questions after 5pm please refer to AMION to reach the Neurologist on call

## 2021-04-28 NOTE — Procedures (Addendum)
Patient Name: Jeremy Burns  MRN: [id number]  Epilepsy Attending: Lora Havens  Referring Physician/Provider: Dr Donnetta Simpers Duration: 04/27/2021 1801 to 1027/2022 1801   Patient history: 45 year old male with 2 minutes of seizure activity described as right-sided twitching with right gaze deviation followed by right-sided weakness.  EEG to evaluate for seizures.   Level of alertness: Awake, asleep   AEDs during EEG study: LEV   Technical aspects: This EEG study was done with scalp electrodes positioned according to the 10-20 International system of electrode placement. Electrical activity was acquired at a sampling rate of 500Hz  and reviewed with a high frequency filter of 70Hz  and a low frequency filter of 1Hz . EEG data were recorded continuously and digitally stored.    Description: The posterior dominant rhythm consists of 9 Hz activity of moderate voltage (25-35 uV) seen predominantly in posterior head regions, symmetric and reactive to eye opening and eye closing.  Sleep was characterized by meticulous, sleep spindles (12 to 14 Hz), maximal frontocentral region.   Four seizures were recorded on 04/27/2021 at 2005 and 2122, lasting about 3 minutes each and on 04/28/2021 at 0837 and 1207, lasting about 7 minutes and 2.5 minutes respectively. During the seizures patient was laying in bed and no clinical signs were noted.  EEG initially showed sharply contoured 4 to 5 Hz theta slowing in left frontal-anterior temporal region which then spread to all of left hemisphere followed by right hemisphere and evolved into 2 to 3 Hz high amplitude spike and wave activity.     ABNORMALITY -Focal seizure, left frontal-anterior temporal region   IMPRESSION: This study showed four seizures arising from left frontal-anterior temporal region during which patient either had no clinical signs or had trouble getting his words out. The seizures were noted on 04/27/2021 at 2005 and 2122, lasting about 3  minutes and on 04/28/2021 at 0837 and 1027 lasting about 7 minutes and 2.5 minutes respectively.    [name]

## 2021-04-28 NOTE — Procedures (Signed)
LUMBAR PUNCTURE (SPINAL TAP) PROCEDURE NOTE  Indication: seizures   Proceduralists: Clance Boll, NP   Risks of the procedure were dicussed with the patient including post-LP headache, bleeding, infection, weakness/numbness of legs(radiculopathy), death.    Consent obtained from: patient    Procedure Note The patient was prepped and draped, and using sterile technique a 20 gauge quinke spinal needle was inserted in the L3-4 space.    After removing and reinserting needle x 4, NP ended the attempt.  Will get LP under fluoro, hopefully tomorrow. Hold Lovenox until LP completed.   Clance Boll, MSN, APN-BC Neurology Nurse Practitioner Pager [phone]

## 2021-04-28 NOTE — Progress Notes (Signed)
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 -In the emergency room he was noted to have CBG greater than 600 without DKA -Seen by neurology, loaded with Keppra, EEG ordered   Assessment & Plan:  New onset seizures Abnormal MRI-multiple enhancing parenchymal lesions -EEG abnormal noted epileptiform activity arising from left frontal anterior temporal region -Neurology following, continue Keppra, dose increased, LTM with multiple seizures noted -MRI brain concerning for multiple enhancing parenchymal lesions, etiology is unclear -HIV negative, check LDH -Neurology following, plan for CT chest abdomen pelvis to rule out malignancy and LP to rule out infection  Uncontrolled type 2 diabetes mellitus Hyperosmolar nonketotic hyperglycemia -CBG> 600 on admission, without DKA, patient does not remember missing any insulin doses -At baseline on Levemir 55 units twice daily and NovoLog 3 times daily with meals,  -CBGs in 200s, increase Semglee dose -HbA1c> 15.5 -Metformin on hold  History of CAD History of chronic systolic and diastolic CHF with AICD -EF unknown -Clinically euvolemic at this time, continue carvedilol, amiodarone, statin -Hold Lasix and Aldactone today  Hypertension -BP soft, hold Cozaar  DVT prophylaxis: Lovenox Code Status: Full code Family Communication: Discussed with patient in detail, no family at bedside, he declines my offer to update his family Disposition Plan:  Status is: Inpatient  Remains inpatient appropriate because: Severity  of illness   Consultants:  Neurology  Procedures:   Antimicrobials:    Subjective: -LTM positive for multiple seizures, no issues overnight  Objective: Vitals:   04/28/21 0000 04/28/21 0349 04/28/21 0830 04/28/21 1244  BP: 103/75 114/71 119/77 96/74  Pulse: 74 68 75 71  Resp: _0 Temp: 98.2 F (36.8 C) 98.6 F (37 C) 98.6 F (37 C) 98.6 F (37 C)  TempSrc: Oral Oral Oral Oral  SpO2: 94% 98% 98% 98%  Weight:      Height:        Intake/Output Summary (Last 24 hours) at 04/28/2021 1352 Last data filed at 04/28/2021 1300 Gross per 24 hour  Intake 1628.64 ml  Output 300 ml  Net 1328.64 ml   Filed Weights   04/26/21 1700 04/27/21 1513  Weight: 42.6 kg 98.1 kg    Examination:  General exam: Averagely built male sitting up in bed, AAOx3, no distress, mild cognitive delay CVS: S1-S2, regular rate rhythm Lungs: Clear bilaterally Abdomen: Soft, nontender, bowel sounds present Extremities: No edema Skin: No rash on exposed skin Psychiatry: Mood & affect appropriate.     Data Reviewed:   CBC: Recent Labs  Lab 04/26/21 0959 04/26/21 1002 04/26/21 1009 04/27/21 0343 04/28/21 0245  WBC  --  6.0  --  6.7 7.4  NEUTROABS  --  4.4  --  4.0  --   HGB 16.7 15.6 16.7 14.4 14.0  HCT 49.0 47.0 49.0 42.9 43.0  MCV  --  90.0  --  89.6 91.1  PLT  --  286  --  274 383   Basic Metabolic Panel: Recent Labs  Lab 04/26/21 0959 04/26/21 1002 04/26/21 1009 04/26/21 1344 04/26/21 1445 04/27/21 0343 04/28/21 0245  NA  133* 131* 132*  --  135 138 133*  K 4.3 4.4 4.3  --  4.5 3.5 3.4*  CL 98 96*  --   --  104 106 105  CO2  --  23  --   --  19* 24 22  GLUCOSE 636* 610*  --   --  273* 211* 283*  BUN 12 11  --   --  _0 CREATININE 1.00 1.17  --   --  0.91 0.89 0.98  CALCIUM  --  9.1  --   --  8.9 8.9 8.5*  MG  --   --   --  2.2  --   --   --   PHOS  --   --   --  3.0  --   --   --    GFR: Estimated Creatinine Clearance: 112.9 mL/min (by C-G formula  based on SCr of 0.98 mg/dL). Liver Function Tests: Recent Labs  Lab 04/26/21 1002  AST 19  ALT 12  ALKPHOS 84  BILITOT 0.8  PROT 7.4  ALBUMIN 3.6   No results for input(s): LIPASE, AMYLASE in the last 168 hours. No results for input(s): AMMONIA in the last 168 hours. Coagulation Profile: Recent Labs  Lab 04/26/21 1002 04/28/21 1025  INR 1.0 1.0   Cardiac Enzymes: No results for input(s): CKTOTAL, CKMB, CKMBINDEX, TROPONINI in the last 168 hours. BNP (last 3 results) No results for input(s): PROBNP in the last 8760 hours. HbA1C: Recent Labs    04/26/21 1002  HGBA1C >15.5*   CBG: Recent Labs  Lab 04/27/21 1113 04/27/21 1714 04/27/21 2137 04/28/21 0640 04/28/21 1246  GLUCAP 189* 285* 360* 261* 257*   Lipid Profile: Recent Labs    04/27/21 0343  CHOL 164  HDL 40*  LDLCALC 103*  TRIG 106  CHOLHDL 4.1   Thyroid Function Tests: No results for input(s): TSH, T4TOTAL, FREET4, T3FREE, THYROIDAB in the last 72 hours. Anemia Panel: No results for input(s): VITAMINB12, FOLATE, FERRITIN, TIBC, IRON, RETICCTPCT in the last 72 hours. Urine analysis:    Component Value Date/Time   COLORURINE STRAW (A) 04/26/2021 1030   APPEARANCEUR CLEAR 04/26/2021 1030   LABSPEC 1.026 04/26/2021 1030   PHURINE 6.0 04/26/2021 1030   GLUCOSEU >=500 (A) 04/26/2021 1030   HGBUR NEGATIVE 04/26/2021 1030   [place] 04/26/2021 1030   KETONESUR NEGATIVE 04/26/2021 1030   PROTEINUR 100 (A) 04/26/2021 1030   NITRITE NEGATIVE 04/26/2021 1030   LEUKOCYTESUR NEGATIVE 04/26/2021 1030   Sepsis Labs: _1 (procalcitonin:4,lacticidven:4)  )No results found for this or any previous visit (from the past 240 hour(s)).    Radiology Studies: MR BRAIN W WO CONTRAST  Result Date: 04/27/2021 CLINICAL DATA:  Seizure, abnormal neuro exam; frequent new onset left frontotemporal seizure, underlying mass? EXAM: MRI HEAD WITHOUT AND WITH CONTRAST TECHNIQUE: Multiplanar, multiecho  pulse sequences of the brain and surrounding structures were obtained without and with intravenous contrast. CONTRAST:  102m GADAVIST GADOBUTROL 1 MMOL/ML IV SOLN COMPARISON:  Head CT April 26, 2021 FINDINGS: The study is partially degraded by motion. Brain: No acute infarction, hemorrhage, hydrocephalus or extra-axial collection. Multiple enhancing lesions in the left frontoparietal and insular region with some of the lesions appear to be in the parenchyma and some appearing to have is a leptomeningeal component. Minimal associated T2 hyperintensity without significant mass effect. Vascular: Normal flow voids. Skull and upper cervical spine: Normal marrow signal. Sinuses/Orbits: Negative. Other: None. IMPRESSION: Multiple enhancing parenchymal and leptomeningeal lesion  in the left frontoparietal and insular region, nonspecific. Differential diagnosis include leptomeningeal processes such as infection and granulomatous disease versus malignancy. No significant mass effect or hemorrhage. Electronically Signed   By: Pedro Earls M.D.   On: 04/27/2021 16:47   EEG adult  Result Date: 04/26/2021 Lora Havens, MD     04/26/2021  2:41 PM Patient Name: Leeman Johnsey MRN: [id number] Epilepsy Attending: Lora Havens Referring Physician/Provider: Dr Donnetta Simpers Date: 04/26/2021 Duration: 25.05 mins Patient history: 45 year old male with 2 minutes of seizure activity described as right-sided twitching with right gaze deviation followed by right-sided weakness.  EEG to evaluate for seizures. Level of alertness: Awake, asleep AEDs during EEG study: LEV Technical aspects: This EEG study was done with scalp electrodes positioned according to the 10-20 International system of electrode placement. Electrical activity was acquired at a sampling rate of _0  and reviewed with a high frequency filter of _1  and a low frequency filter of _2 . EEG data were recorded continuously and digitally  stored. Description: The posterior dominant rhythm consists of 9 Hz activity of moderate voltage (25-35 uV) seen predominantly in posterior head regions, symmetric and reactive to eye opening and eye closing. Physiologic photic driving was not seen during photic stimulation.  Hyperventilation was not performed.   IMPRESSION: This study is within normal limits. No seizures or epileptiform discharges were seen throughout the recording. Priyanka Barbra Sarks   Overnight EEG with video  Result Date: 04/27/2021 Lora Havens, MD     04/28/2021  9:51 AM Patient Name: Owen Pratte MRN: [id number] Epilepsy Attending: Lora Havens Referring Physician/Provider: Dr Donnetta Simpers Duration: 04/26/2021 1406 to 1026/2022 1417  Patient history: 45 year old male with 2 minutes of seizure activity described as right-sided twitching with right gaze deviation followed by right-sided weakness.  EEG to evaluate for seizures.  Level of alertness: Awake, asleep  AEDs during EEG study: LEV  Technical aspects: This EEG study was done with scalp electrodes positioned according to the 10-20 International system of electrode placement. Electrical activity was acquired at a sampling rate of _3  and reviewed with a high frequency filter of _4  and a low frequency filter of _5 . EEG data were recorded continuously and digitally stored.  Description: The posterior dominant rhythm consists of 9 Hz activity of moderate voltage (25-35 uV) seen predominantly in posterior head regions, symmetric and reactive to eye opening and eye closing.  Sleep was characterized by meticulous, sleep spindles (12 to 14 Hz), maximal frontocentral region. Seizures were noted to be arising from left frontal-anterior temporal region.  During most of the seizures patient was asleep and no clinical signs were noted.  However, during the seizure at [place] AM on 04/27/2021 patient was working with a therapist and suddenly was noted to have trouble speaking, kept  repeating " he said umm", was able to follow simple commands like sitting down on bed.  During all the seizures, EEG initially showed sharply contoured 4 to 5 Hz theta slowing in left frontal-anterior temporal region which then spread to all of left hemisphere followed by right hemisphere and evolved into 2 to 3 Hz high amplitude spike and wave activity.  There was average 1 seizure per hour, lasting about 1 to 2 minutes each.  Last seizure was noted 04/27/2021 at 1132 ABNORMALITY -Focal seizure, left frontal-anterior temporal region IMPRESSION: This study showed seizures arising from left frontal-anterior temporal region during which patient either had no clinical signs or had trouble speaking.  There was average  1 seizure per hour, lasting about 1 to 2 minutes each.  Last seizure was on 04/27/2021 at 1132. Lora Havens   ECHOCARDIOGRAM COMPLETE  Result Date: 04/27/2021    ECHOCARDIOGRAM REPORT   Patient Name:   CHUE BERKOVICH Date of Exam: 04/27/2021 Medical Rec #:  [id number]      Height:       70.0 in Accession #:    [id number]     Weight:       93.9 lb Date of Birth:  May 13, 1976      BSA:          1.514 m Patient Age:    45 years       BP:           105/68 mmHg Patient Gender: M              HR:           73 bpm. Exam Location:  Inpatient Procedure: 2D Echo, Cardiac Doppler, Color Doppler and Intracardiac            Opacification Agent Indications:    Seizure  History:        Patient has no prior history of Echocardiogram examinations.                 CHF; Defibrillator.  Sonographer:    Glo Herring Referring Phys: [id number] [place]  1. Left ventricular ejection fraction, by estimation, is <20%. The left ventricle has severely decreased function. The left ventricle demonstrates global hypokinesis. The left ventricular internal cavity size was severely dilated. Left ventricular diastolic parameters are consistent with Grade III diastolic dysfunction (restrictive). No LV thrombus was  noted.  2. Right ventricular systolic function is mildly reduced. The right ventricular size is normal. Tricuspid regurgitation signal is inadequate for assessing PA pressure.  3. Left atrial size was moderately dilated.  4. The mitral valve is normal in structure. Moderate mitral valve regurgitation, likely functional due to dilated annulus. No evidence of mitral stenosis.  5. The aortic valve is tricuspid. Aortic valve regurgitation is not visualized. No aortic stenosis is present.  6. The inferior vena cava is dilated in size with >50% respiratory variability, suggesting right atrial pressure of 8 mmHg. FINDINGS  Left Ventricle: Left ventricular ejection fraction, by estimation, is <20%. The left ventricle has severely decreased function. The left ventricle demonstrates global hypokinesis. The left ventricular internal cavity size was severely dilated. There is no left ventricular hypertrophy. Left ventricular diastolic parameters are consistent with Grade III diastolic dysfunction (restrictive). Right Ventricle: The right ventricular size is normal. No increase in right ventricular wall thickness. Right ventricular systolic function is mildly reduced. Tricuspid regurgitation signal is inadequate for assessing PA pressure. Left [facility]: Left atrial size was moderately dilated. Right [facility]: Right atrial size was normal in size. Pericardium: There is no evidence of pericardial effusion. Mitral Valve: The mitral valve is normal in structure. Moderate mitral valve regurgitation. No evidence of mitral valve stenosis. Tricuspid Valve: The tricuspid valve is normal in structure. Tricuspid valve regurgitation is not demonstrated. Aortic Valve: The aortic valve is tricuspid. Aortic valve regurgitation is not visualized. No aortic stenosis is present. Pulmonic Valve: The pulmonic valve was normal in structure. Pulmonic valve regurgitation is not visualized. Aorta: The aortic root is normal in size and structure. Venous:  The inferior vena cava is dilated in size with greater than 50% respiratory variability, suggesting right atrial pressure of 8 mmHg. IAS/Shunts: No atrial level shunt  detected by color flow Doppler. Additional Comments: A device lead is visualized in the right ventricle.  LEFT VENTRICLE PLAX 2D LVIDd:         7.80 cm Diastology LVIDs:         7.50 cm LV e' medial:    5.48 cm/s LV PW:         1.20 cm LV E/e' medial:  16.8 LV IVS:        1.20 cm LV e' lateral:   6.00 cm/s                        LV E/e' lateral: 15.4  RIGHT VENTRICLE            IVC RV S prime:     8.40 cm/s  IVC diam: 2.20 cm LEFT ATRIUM              Index LA diam:        4.80 cm  3.17 cm/m LA Vol (A2C):   133.0 ml 87.86 ml/m LA Vol (A4C):   80.3 ml  53.05 ml/m LA Biplane Vol: 108.0 ml 71.34 ml/m   AORTA Ao Root diam: 3.50 cm MITRAL VALVE MV Area (PHT): 5.27 cm MV Decel Time: 144 msec MV E velocity: 92.10 cm/s MV A velocity: 68.60 cm/s MV E/A ratio:  1.34 Dalton McleanMD Electronically signed by Franki Monte Signature Date/Time: 04/27/2021/4:13:14 PM    Final     Scheduled Meds:   stroke: mapping our early stages of recovery book   Does not apply Once   amiodarone  200 mg Oral Daily   aspirin EC  81 mg Oral Daily   atorvastatin  80 mg Oral Daily   carvedilol  25 mg Oral BID WC   chlorhexidine gluconate (MEDLINE KIT)  15 mL Mouth Rinse BID   enoxaparin (LOVENOX) injection  40 mg Subcutaneous Q24H   insulin aspart  0-15 Units Subcutaneous TID WC   insulin aspart  0-5 Units Subcutaneous QHS   insulin glargine-yfgn  40 Units Subcutaneous Daily   levETIRAcetam  1,000 mg Oral BID   mouth rinse  15 mL Mouth Rinse 10 times per day   Continuous Infusions:  sodium chloride 10 mL/hr at 04/28/21 0815     LOS: 2 days    Time spent: 82mn  PDomenic Polite MD [facility]   04/28/2021, 1:52 PM

## 2021-04-28 NOTE — Progress Notes (Signed)
Maint complete. No skin breakdown at Fp2, CZ, A2

## 2021-04-28 NOTE — Progress Notes (Signed)
It was reported to this nurse by the patient that he had had another "staring off" episode.  This was concurred with by Nurse Practitioner.

## 2021-04-28 NOTE — Progress Notes (Signed)
LTM maint complete - no skin breakdown under: Fp1 F3 A1 fixed A1 C4 Fp1 , a little irritation at Fp1- lead adjusted [facility] monitored, Event button test confirmed by [facility].

## 2021-04-28 NOTE — Evaluation (Signed)
Physical Therapy Evaluation Patient Details Name: Jeremy Burns MRN: [id number] DOB: 1976/03/14 Today's Date: 04/28/2021  History of Present Illness  45 year old male who presented to [facility] after found down in his tractor-trailer and EMS witnessed a 45-second episode of seizure activity with bilateral upper extremity general tonic-clonic behavior, resolved after about 2 minutes followed by inability to raise his right arm. MRI revealed multiple enhancing parenchymal and leptomeningeal lesion in the left  frontoparietal and insular region. PMHx: none on file   Clinical Impression  Pt admitted with above diagnosis. Pt currently with functional limitations due to the deficits listed below (see PT Problem List). At the time of PT eval pt was able to perform transfers with modified independence and no AD. Pt stoof EOB ~10 minutes to wash up and wait while sheets changes. Pt sitting EOB to get gown changed, and pt with sudden confusion on where to put his R arm (therapist holding arm hole of gown open for him to put his arm through). Pt stopped verbally responding except saying "no". Pt unable to tell me his name or where he was. Noted R eye twitching and pt with overall glazed look in his eyes. Therapist facilitated pt to return to supine and he began crying, saying he was scared and just wants to be well. Therapist provided emotional support and left pt to update nurse on possible seizure activity. This occurred ~12:10 pm. Pt will benefit from skilled PT to increase their independence and safety with mobility to allow discharge to the venue listed below.          Recommendations for follow up therapy are one component of a multi-disciplinary discharge planning process, led by the attending physician.  Recommendations may be updated based on patient status, additional functional criteria and insurance authorization.  Follow Up Recommendations No PT follow up    Assistance Recommended at Discharge  Intermittent Supervision/Assistance  Functional Status Assessment Patient has had a recent decline in their functional status and demonstrates the ability to make significant improvements in function in a reasonable and predictable amount of time.  Equipment Recommendations  None recommended by PT    Recommendations for Other Services       Precautions / Restrictions Precautions Precautions: Fall Precaution Comments: seizure Restrictions Weight Bearing Restrictions: No      Mobility  Bed Mobility Overal bed mobility: Modified Independent             General bed mobility comments: HOB slightly elevated & use of rail    Transfers Overall transfer level: Modified independent Equipment used: None Transfers: Sit to/from Stand             General transfer comment: No unsteadiness or LOB noted.    Ambulation/Gait             General Gait Details: Unable to progress as pt still connected to EEG  Stairs            Wheelchair Mobility    Modified Rankin (Stroke Patients Only)       Balance Overall balance assessment: Needs assistance Sitting-balance support: Feet supported Sitting balance-Leahy Scale: Normal     Standing balance support: No upper extremity supported;During functional activity Standing balance-Leahy Scale: Good                               Pertinent Vitals/Pain Pain Assessment: No/denies pain    Home Living Family/patient expects to be discharged to:: Private  residence Living Arrangements: Children;Non-relatives/Friends (male "friend") Available Help at Discharge: Available PRN/intermittently Type of Home: House Home Access: Stairs to enter   [employer] of Steps: 5 at one entrance, 3 at the other   Home Layout: One level Home Equipment: None      Prior Function Prior Level of Function : Independent/Modified Independent;Working/employed             Mobility Comments: no AD ADLs Comments:  indep, works part time as a Cytogeneticist: Right    Extremity/Trunk Assessment   Upper Extremity Assessment Upper Extremity Assessment: Overall WFL for tasks assessed    Lower Extremity Assessment Lower Extremity Assessment: Overall WFL for tasks assessed    Cervical / Trunk Assessment Cervical / Trunk Assessment: Normal  Communication   Communication: No difficulties  Cognition Arousal/Alertness: Awake/alert Behavior During Therapy: Impulsive (mild) Overall Cognitive Status: Impaired/Different from baseline Area of Impairment: Attention;Memory;Following commands;Safety/judgement;Problem solving                   Current Attention Level: Selective Memory: Decreased short-term memory Following Commands: Follows one step commands consistently;Follows multi-step commands inconsistently Safety/Judgement: Decreased awareness of safety;Decreased awareness of deficits   Problem Solving: Requires verbal cues General Comments: pt is Ox4, unable to multitask such as hold conversation while sponge bathing, often trails of in conversation and after verbal cue back to what he was saying he would state "i forget" - pt's report of his main concern are related to thinking skills, being forgetful and not remeber what he is trying to communicate        General Comments      Exercises     Assessment/Plan    PT Assessment Patient needs continued PT services  PT Problem List Decreased activity tolerance;Decreased cognition;Decreased safety awareness       PT Treatment Interventions DME instruction;Gait training;Functional mobility training;Therapeutic activities;Therapeutic exercise;Neuromuscular re-education;Patient/family education    PT Goals (Current goals can be found in the Care Plan section)  Acute Rehab PT Goals Patient Stated Goal: Be able to get home PT Goal Formulation: With patient Time For Goal Achievement: 05/05/21 Potential  to Achieve Goals: Good    Frequency Min 3X/week   Barriers to discharge        Co-evaluation               AM-PAC PT "6 Clicks" Mobility  Outcome Measure Help needed turning from your back to your side while in a flat bed without using bedrails?: None Help needed moving from lying on your back to sitting on the side of a flat bed without using bedrails?: None Help needed moving to and from a bed to a chair (including a wheelchair)?: None Help needed standing up from a chair using your arms (e.g., wheelchair or bedside chair)?: None Help needed to walk in hospital room?: None Help needed climbing 3-5 steps with a railing? : None 6 Click Score: 24    End of Session   Activity Tolerance: Other (comment) (Pt with possible seizure activity and session ended) Patient left: in bed;with call bell/phone within reach;with bed alarm set Nurse Communication: Mobility status;Other (comment) (Possible seizure activity) PT Visit Diagnosis: Other symptoms and signs involving the nervous system (W62.035)    Time: 5974-1638 PT Time Calculation (min) (ACUTE ONLY): 28 min   Charges:   PT Evaluation $PT Eval Moderate Complexity: 1 Mod PT Treatments $Gait Training: 8-22 mins  Rolinda Roan, PT, DPT Acute Rehabilitation Services Pager: [phone] Office: [phone]   Thelma Comp 04/28/2021, 2:00 PM

## 2021-04-29 ENCOUNTER — Inpatient Hospital Stay (HOSPITAL_COMMUNITY): Payer: Medicare PPO

## 2021-04-29 LAB — CSF CELL COUNT WITH DIFFERENTIAL
RBC Count, CSF: 0 /mm3
RBC Count, CSF: 8 /mm3 — ABNORMAL HIGH
Tube #: 1
Tube #: 4
WBC, CSF: 1 /mm3 (ref 0–5)
WBC, CSF: 4 /mm3 (ref 0–5)

## 2021-04-29 LAB — PROTEIN AND GLUCOSE, CSF
Glucose, CSF: 148 mg/dL — ABNORMAL HIGH (ref 40–70)
Total  Protein, CSF: 104 mg/dL — ABNORMAL HIGH (ref 15–45)

## 2021-04-29 LAB — CRYPTOCOCCAL ANTIGEN, CSF: Crypto Ag: NEGATIVE

## 2021-04-29 LAB — GLUCOSE, CAPILLARY
Glucose-Capillary: 271 mg/dL — ABNORMAL HIGH (ref 70–99)
Glucose-Capillary: 283 mg/dL — ABNORMAL HIGH (ref 70–99)
Glucose-Capillary: 243 mg/dL — ABNORMAL HIGH (ref 70–99)
Glucose-Capillary: 147 mg/dL — ABNORMAL HIGH (ref 70–99)

## 2021-04-29 LAB — HIV ANTIBODY (ROUTINE TESTING W REFLEX): HIV Screen 4th Generation wRfx: NONREACTIVE

## 2021-04-29 MED ORDER — INFLUENZA VAC SPLIT QUAD 0.5 ML IM SUSY
0.5000 mL | PREFILLED_SYRINGE | INTRAMUSCULAR | Status: DC | PRN
  Filled 2021-04-29: qty 0.5

## 2021-04-29 MED ORDER — INSULIN ASPART 100 UNIT/ML IJ SOLN
3.0000 [IU] | Freq: Three times a day (TID) | INTRAMUSCULAR | Status: DC
  Administered 2021-04-29 – 2021-04-30 (×4): 3 [IU] via SUBCUTANEOUS

## 2021-04-29 MED ORDER — IOHEXOL 350 MG/ML SOLN
100.0000 mL | Freq: Once | INTRAVENOUS | Status: AC | PRN
  Administered 2021-04-29: 100 mL via INTRAVENOUS

## 2021-04-29 MED ORDER — INSULIN GLARGINE-YFGN 100 UNIT/ML ~~LOC~~ SOLN
45.0000 [IU] | Freq: Every day | SUBCUTANEOUS | Status: DC
  Administered 2021-04-29 – 2021-05-05 (×7): 45 [IU] via SUBCUTANEOUS
  Filled 2021-04-29 (×8): qty 0.45

## 2021-04-29 MED ORDER — IOHEXOL 9 MG/ML PO SOLN
ORAL | Status: AC
  Administered 2021-04-29: 500 mL
  Filled 2021-04-29: qty 1000

## 2021-04-29 MED ORDER — LEVETIRACETAM 750 MG PO TABS
1500.0000 mg | ORAL_TABLET | Freq: Two times a day (BID) | ORAL | Status: DC
  Administered 2021-04-29 – 2021-05-06 (×15): 1500 mg via ORAL
  Filled 2021-04-29 (×7): qty 2
  Filled 2021-04-29: qty 6
  Filled 2021-04-29 (×7): qty 2

## 2021-04-29 MED ORDER — LIDOCAINE HCL (PF) 1 % IJ SOLN
5.0000 mL | Freq: Once | INTRAMUSCULAR | Status: AC
  Administered 2021-04-29: 5 mL via INTRADERMAL

## 2021-04-29 MED ORDER — INFLUENZA VAC SPLIT QUAD 0.5 ML IM SUSY: 0.5000 mL | PREFILLED_SYRINGE | INTRAMUSCULAR | Status: DC

## 2021-04-29 NOTE — Plan of Care (Signed)
NP called by Maudie Mercury in lab about extra orders having been put in on CSF since yesterday. NP called Dr. Hortense Ramal and verified correct orders and NP deleted duplicates. NP called Maudie Mercury again to let her know that whatever CSF orders are in the computer now, is what we want done.   Clance Boll, MSN, APN-BC Neurology Nurse Practitioner Pager [phone]

## 2021-04-29 NOTE — Progress Notes (Signed)
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 -In the emergency room he was noted to have CBG greater than 600 without DKA -Seen by neurology, loaded with Keppra, EEG ordered   Assessment & Plan:  New onset seizures Abnormal MRI-multiple enhancing parenchymal lesions -EEG abnormal noted epileptiform activity arising from left frontal anterior temporal region -Neurology following, continue Keppra, LTM with 1 seizure overnight, Keppra dose increased -MRI brain concerning for multiple enhancing parenchymal lesions, etiology is unclear -HIV negative,  -Neurology following, plan for CT chest abdomen pelvis to rule out malignancy and LP to rule out infection -LP to be attempted under fluoroscopy today, check LDH  Uncontrolled type 2 diabetes mellitus Hyperosmolar nonketotic hyperglycemia -CBG> 600 on admission, without DKA, patient does not remember missing any insulin doses -At baseline on Levemir 55 units twice daily and NovoLog 3 times daily with meals,  -CBGs in 200s, increase Semglee dose, add NovoLog with meals -HbA1c> 15.5 -Metformin on hold  History of CAD History of chronic systolic and diastolic CHF with AICD -EF unknown -Clinically euvolemic at this time, continue carvedilol, amiodarone, statin -Hold Lasix and Aldactone today  Hypertension -BP soft, hold Cozaar  DVT prophylaxis: Lovenox on hold for LP Code Status: Full code Family Communication: Discussed with patient in detail, no family at bedside, he declines my offer to update his  family Disposition Plan:  Status is: Inpatient  Remains inpatient appropriate because: Severity of illness   Consultants:  Neurology IR for LP  Procedures:   Antimicrobials:    Subjective: -LTM positive for 1 seizure overnight, patient denies any issues   Objective: Vitals:   04/28/21 1952 04/28/21 2349 04/29/21 0358 04/29/21 0852  BP: 113/81 107/71 107/68 111/77  Pulse: 74 73 71 69  Resp: '18 18 16 18  ' Temp: 98.3 F (36.8 C) 97.8 F (36.6 C) 97.7 F (36.5 C) 98.1 F (36.7 C)  TempSrc: Oral Oral Oral Oral  SpO2: 100% 99% 100% 99%  Weight:      Height:        Intake/Output Summary (Last 24 hours) at 04/29/2021 1208 Last data filed at 04/29/2021 0626 Gross per 24 hour  Intake 1187.5 ml  Output 2425 ml  Net -1237.5 ml   Filed Weights   04/26/21 1700 04/27/21 1513  Weight: 42.6 kg 98.1 kg    Examination:  General exam: Averagely built male sitting up in bed, AAOx3, no distress, mild cognitive delay noted CVS: S1-S2, regular rate rhythm Lungs: Clear bilaterally Abdomen: Soft, nontender, bowel sounds present Extremities: No edema  Skin: No rash on exposed skin Psychiatry: Mood & affect appropriate.     Data Reviewed:   CBC: Recent Labs  Lab 04/26/21 0959 04/26/21 1002 04/26/21 1009 04/27/21 0343 04/28/21 0245  WBC  --  6.0  --  6.7 7.4  NEUTROABS  --  4.4  --  4.0  --   HGB 16.7 15.6 16.7 14.4 14.0  HCT 49.0 47.0 49.0 42.9 43.0  MCV  --  90.0  --  89.6 91.1  PLT  --  286  --  274 239  Basic Metabolic Panel: Recent Labs  Lab 04/26/21 0959 04/26/21 1002 04/26/21 1009 04/26/21 1344 04/26/21 1445 04/27/21 0343 04/28/21 0245  NA 133* 131* 132*  --  135 138 133*  K 4.3 4.4 4.3  --  4.5 3.5 3.4*  CL 98 96*  --   --  104 106 105  CO2  --  23  --   --  19* 24 22  GLUCOSE 636* 610*  --   --  273* 211* 283*  BUN 12 11  --   --  '10 11 11  ' CREATININE 1.00 1.17  --   --  0.91 0.89 0.98  CALCIUM  --  9.1  --   --  8.9 8.9 8.5*  MG  --   --    --  2.2  --   --   --   PHOS  --   --   --  3.0  --   --   --    GFR: Estimated Creatinine Clearance: 112.9 mL/min (by C-G formula based on SCr of 0.98 mg/dL). Liver Function Tests: Recent Labs  Lab 04/26/21 1002  AST 19  ALT 12  ALKPHOS 84  BILITOT 0.8  PROT 7.4  ALBUMIN 3.6   No results for input(s): LIPASE, AMYLASE in the last 168 hours. No results for input(s): AMMONIA in the last 168 hours. Coagulation Profile: Recent Labs  Lab 04/26/21 1002 04/28/21 1025  INR 1.0 1.0   Cardiac Enzymes: No results for input(s): CKTOTAL, CKMB, CKMBINDEX, TROPONINI in the last 168 hours. BNP (last 3 results) No results for input(s): PROBNP in the last 8760 hours. HbA1C: No results for input(s): HGBA1C in the last 72 hours.  CBG: Recent Labs  Lab 04/27/21 2137 04/28/21 0640 04/28/21 1246 04/28/21 2207 04/29/21 0613  GLUCAP 360* 261* 257* 352* 271*   Lipid Profile: Recent Labs    04/27/21 0343  CHOL 164  HDL 40*  LDLCALC 103*  TRIG 106  CHOLHDL 4.1   Thyroid Function Tests: No results for input(s): TSH, T4TOTAL, FREET4, T3FREE, THYROIDAB in the last 72 hours. Anemia Panel: No results for input(s): VITAMINB12, FOLATE, FERRITIN, TIBC, IRON, RETICCTPCT in the last 72 hours. Urine analysis:    Component Value Date/Time   COLORURINE STRAW (A) 04/26/2021 1030   APPEARANCEUR CLEAR 04/26/2021 1030   LABSPEC 1.026 04/26/2021 1030   PHURINE 6.0 04/26/2021 1030   GLUCOSEU >=500 (A) 04/26/2021 1030   HGBUR NEGATIVE 04/26/2021 1030   [place] 04/26/2021 1030   KETONESUR NEGATIVE 04/26/2021 1030   PROTEINUR 100 (A) 04/26/2021 1030   NITRITE NEGATIVE 04/26/2021 1030   LEUKOCYTESUR NEGATIVE 04/26/2021 1030   Sepsis Labs: '@LABRCNTIP' (procalcitonin:4,lacticidven:4)  )No results found for this or any previous visit (from the past 240 hour(s)).    Radiology Studies: MR BRAIN W WO CONTRAST  Result Date: 04/27/2021 CLINICAL DATA:  Seizure, abnormal neuro exam;  frequent new onset left frontotemporal seizure, underlying mass? EXAM: MRI HEAD WITHOUT AND WITH CONTRAST TECHNIQUE: Multiplanar, multiecho pulse sequences of the brain and surrounding structures were obtained without and with intravenous contrast. CONTRAST:  56m GADAVIST GADOBUTROL 1 MMOL/ML IV SOLN COMPARISON:  Head CT April 26, 2021 FINDINGS: The study is partially degraded by motion. Brain: No acute infarction, hemorrhage, hydrocephalus or extra-axial collection. Multiple enhancing lesions in the left frontoparietal and insular region with some of the lesions appear to be in the parenchyma and some appearing to have is a leptomeningeal component. Minimal associated T2 hyperintensity without significant mass effect. Vascular:  Normal flow voids. Skull and upper cervical spine: Normal marrow signal. Sinuses/Orbits: Negative. Other: None. IMPRESSION: Multiple enhancing parenchymal and leptomeningeal lesion in the left frontoparietal and insular region, nonspecific. Differential diagnosis include leptomeningeal processes such as infection and granulomatous disease versus malignancy. No significant mass effect or hemorrhage. Electronically Signed   By: Pedro Earls M.D.   On: 04/27/2021 16:47   ECHOCARDIOGRAM COMPLETE  Result Date: 04/27/2021    ECHOCARDIOGRAM REPORT   Patient Name:   Jeremy Burns Date of Exam: 04/27/2021 Medical Rec #:  [id number]      Height:       70.0 in Accession #:    [id number]     Weight:       93.9 lb Date of Birth:  01-30-1976      BSA:          1.514 m Patient Age:    6 years       BP:           105/68 mmHg Patient Gender: M              HR:           73 bpm. Exam Location:  Inpatient Procedure: 2D Echo, Cardiac Doppler, Color Doppler and Intracardiac            Opacification Agent Indications:    Seizure  History:        Patient has no prior history of Echocardiogram examinations.                 CHF; Defibrillator.  Sonographer:    Glo Herring Referring Phys:  [id number] [place]  1. Left ventricular ejection fraction, by estimation, is <20%. The left ventricle has severely decreased function. The left ventricle demonstrates global hypokinesis. The left ventricular internal cavity size was severely dilated. Left ventricular diastolic parameters are consistent with Grade III diastolic dysfunction (restrictive). No LV thrombus was noted.  2. Right ventricular systolic function is mildly reduced. The right ventricular size is normal. Tricuspid regurgitation signal is inadequate for assessing PA pressure.  3. Left atrial size was moderately dilated.  4. The mitral valve is normal in structure. Moderate mitral valve regurgitation, likely functional due to dilated annulus. No evidence of mitral stenosis.  5. The aortic valve is tricuspid. Aortic valve regurgitation is not visualized. No aortic stenosis is present.  6. The inferior vena cava is dilated in size with >50% respiratory variability, suggesting right atrial pressure of 8 mmHg. FINDINGS  Left Ventricle: Left ventricular ejection fraction, by estimation, is <20%. The left ventricle has severely decreased function. The left ventricle demonstrates global hypokinesis. The left ventricular internal cavity size was severely dilated. There is no left ventricular hypertrophy. Left ventricular diastolic parameters are consistent with Grade III diastolic dysfunction (restrictive). Right Ventricle: The right ventricular size is normal. No increase in right ventricular wall thickness. Right ventricular systolic function is mildly reduced. Tricuspid regurgitation signal is inadequate for assessing PA pressure. Left [facility]: Left atrial size was moderately dilated. Right [facility]: Right atrial size was normal in size. Pericardium: There is no evidence of pericardial effusion. Mitral Valve: The mitral valve is normal in structure. Moderate mitral valve regurgitation. No evidence of mitral valve stenosis. Tricuspid Valve:  The tricuspid valve is normal in structure. Tricuspid valve regurgitation is not demonstrated. Aortic Valve: The aortic valve is tricuspid. Aortic valve regurgitation is not visualized. No aortic stenosis is present. Pulmonic Valve: The pulmonic valve was normal in structure.  Pulmonic valve regurgitation is not visualized. Aorta: The aortic root is normal in size and structure. Venous: The inferior vena cava is dilated in size with greater than 50% respiratory variability, suggesting right atrial pressure of 8 mmHg. IAS/Shunts: No atrial level shunt detected by color flow Doppler. Additional Comments: A device lead is visualized in the right ventricle.  LEFT VENTRICLE PLAX 2D LVIDd:         7.80 cm Diastology LVIDs:         7.50 cm LV e' medial:    5.48 cm/s LV PW:         1.20 cm LV E/e' medial:  16.8 LV IVS:        1.20 cm LV e' lateral:   6.00 cm/s                        LV E/e' lateral: 15.4  RIGHT VENTRICLE            IVC RV S prime:     8.40 cm/s  IVC diam: 2.20 cm LEFT ATRIUM              Index LA diam:        4.80 cm  3.17 cm/m LA Vol (A2C):   133.0 ml 87.86 ml/m LA Vol (A4C):   80.3 ml  53.05 ml/m LA Biplane Vol: 108.0 ml 71.34 ml/m   AORTA Ao Root diam: 3.50 cm MITRAL VALVE MV Area (PHT): 5.27 cm MV Decel Time: 144 msec MV E velocity: 92.10 cm/s MV A velocity: 68.60 cm/s MV E/A ratio:  1.34 Dalton McleanMD Electronically signed by Franki Monte Signature Date/Time: 04/27/2021/4:13:14 PM    Final    DG FL GUIDED LUMBAR PUNCTURE  Result Date: 04/29/2021 CLINICAL DATA:  Provided history: Seizure. EXAM: DIAGNOSTIC LUMBAR PUNCTURE UNDER FLUOROSCOPIC GUIDANCE COMPARISON:  Brain MRI 04/27/2021. FLUOROSCOPY TIME:  Fluoroscopy Time:  30 seconds Radiation Exposure Index (if provided by the fluoroscopic device): 4.8 mGy Number of Acquired Spot Images: None PROCEDURE: Informed consent was obtained from the patient prior to the procedure by Soyla Dryer, NP, following a discussion of procedural risks,  benefits and alternatives. With the patient prone, the lower back was prepped with Betadine. 1% Lidocaine was used for local anesthesia. Lumbar puncture was performed at the L4-L5 level using a 20 gauge needle with return of clear CSF with an opening pressure of 20 cm water. 13 ml of CSF were obtained for laboratory studies. The patient tolerated the procedure well without immediate post-procedure complication. The procedure was performed by Soyla Dryer, NP. IMPRESSION: Technically successful fluoroscopically-guided lumbar puncture at the L4-L5 level. Opening pressure of 20 cm water. 13 mL of CSF obtained for laboratory studies. The patient tolerated the procedure well without immediate post-procedure complication. Electronically Signed   By: Kellie Simmering D.O.   On: 04/29/2021 11:22    Scheduled Meds:   stroke: mapping our early stages of recovery book   Does not apply Once   amiodarone  200 mg Oral Daily   aspirin EC  81 mg Oral Daily   atorvastatin  80 mg Oral Daily   carvedilol  25 mg Oral BID WC   chlorhexidine gluconate (MEDLINE KIT)  15 mL Mouth Rinse BID   insulin aspart  0-15 Units Subcutaneous TID WC   insulin aspart  0-5 Units Subcutaneous QHS   insulin aspart  3 Units Subcutaneous TID WC   insulin glargine-yfgn  45 Units Subcutaneous Daily   levETIRAcetam  1,500 mg Oral BID   mouth rinse  15 mL Mouth Rinse 10 times per day   Continuous Infusions:  sodium chloride 10 mL/hr at 04/28/21 0815     LOS: 3 days    Time spent: 25mn  PDomenic Polite MD [facility]   04/29/2021, 12:08 PM

## 2021-04-29 NOTE — Procedures (Signed)
Patient Name: Jeremy Burns  MRN: [id number]  Epilepsy Attending: Lora Havens  Referring Physician/Provider: Dr Donnetta Simpers Duration: 04/28/2021 1801 to 1028/2022 1801   Patient history: 45 year old male with 2 minutes of seizure activity described as right-sided twitching with right gaze deviation followed by right-sided weakness.  EEG to evaluate for seizures.   Level of alertness: Awake, asleep   AEDs during EEG study: LEV   Technical aspects: This EEG study was done with scalp electrodes positioned according to the 10-20 International system of electrode placement. Electrical activity was acquired at a sampling rate of 500Hz  and reviewed with a high frequency filter of 70Hz  and a low frequency filter of 1Hz . EEG data were recorded continuously and digitally stored.    Description: The posterior dominant rhythm consists of 9 Hz activity of moderate voltage (25-35 uV) seen predominantly in posterior head regions, symmetric and reactive to eye opening and eye closing.  Sleep was characterized by meticulous, sleep spindles (12 to 14 Hz), maximal frontocentral region.   One seizures were recorded on 04/28/2021 at 0654, lasting about 1.5 minutes. During the seizures patient was laying in bed and no clinical signs were noted. EEG initially showed sharply contoured 4 to 5 Hz theta slowing in left frontal-anterior temporal region which then spread to all of left hemisphere followed by right hemisphere and evolved into 2 to 3 Hz high amplitude spike and wave activity.    ABNORMALITY -Focal seizure, left frontal-anterior temporal region   IMPRESSION: This study showed one seizure on 04/28/2021 at 0654, lasting about 1.5 minutes during which patient had no clinical signs.Seizure was arising from left frontal-anterior temporal region    [place]

## 2021-04-29 NOTE — Progress Notes (Signed)
LTM maint complete - no skin breakdown [facility] monitored, Event button test confirmed by [facility]. ? ?

## 2021-04-29 NOTE — Progress Notes (Signed)
SLP Cancellation Note  Patient Details Name: Jeremy Burns MRN: [id number] DOB: 12-Sep-1975   Cancelled treatment:       Reason Eval/Treat Not Completed: Patient at procedure or test/unavailable (Pt off unit at this time. SLP will follow up on subsequent dae.Tobie Poet I. Hardin Negus, [place], [place] Office number [phone] Pager [place] 04/29/2021, 5:07 PM

## 2021-04-29 NOTE — Procedures (Signed)
Technically successful L4/L5 lumbar puncture yielding 13 ml of CSF for laboratory studies.  Please see full dictation in Epic.  Soyla Dryer, [place] [phone] 04/29/2021, 10:55 AM

## 2021-04-29 NOTE — Plan of Care (Signed)
  Problem: Education: Goal: Knowledge of General Education information will improve Description Including pain rating scale, medication(s)/side effects and non-pharmacologic comfort measures Outcome: Progressing   

## 2021-04-29 NOTE — Progress Notes (Signed)
EEG maintenance performed.  Reapplied multiple electrodes.  No skin breakdown observed.

## 2021-04-29 NOTE — Plan of Care (Signed)
  Problem: Education: Goal: Knowledge of General Education information will improve Description: Including pain rating scale, medication(s)/side effects and non-pharmacologic comfort measures Outcome: Progressing   Problem: Health Behavior/Discharge Planning: Goal: Ability to manage health-related needs will improve Outcome: Progressing   Problem: Clinical Measurements: Goal: Ability to maintain clinical measurements within normal limits will improve Outcome: Progressing Goal: Will remain free from infection Outcome: Progressing Goal: Diagnostic test results will improve Outcome: Progressing Goal: Respiratory complications will improve Outcome: Progressing Goal: Cardiovascular complication will be avoided Outcome: Progressing   Problem: Activity: Goal: Risk for activity intolerance will decrease Outcome: Progressing   Problem: Nutrition: Goal: Adequate nutrition will be maintained Outcome: Progressing   Problem: Coping: Goal: Level of anxiety will decrease Outcome: Progressing   Problem: Elimination: Goal: Will not experience complications related to bowel motility Outcome: Progressing Goal: Will not experience complications related to urinary retention Outcome: Progressing   Problem: Pain Managment: Goal: General experience of comfort will improve Outcome: Progressing   Problem: Safety: Goal: Ability to remain free from injury will improve Outcome: Progressing   Problem: Skin Integrity: Goal: Risk for impaired skin integrity will decrease Outcome: Progressing   Problem: Education: Goal: Knowledge of disease or condition will improve Outcome: Progressing Goal: Knowledge of secondary prevention will improve (SELECT ALL) Outcome: Progressing Goal: Knowledge of patient specific risk factors will improve (INDIVIDUALIZE FOR PATIENT) Outcome: Progressing Goal: Individualized Educational Video(s) Outcome: Progressing   Problem: Health Behavior/Discharge  Planning: Goal: Ability to manage health-related needs will improve Outcome: Progressing   Problem: Ischemic Stroke/TIA Tissue Perfusion: Goal: Complications of ischemic stroke/TIA will be minimized Outcome: Progressing

## 2021-04-29 NOTE — Progress Notes (Signed)
Occupational Therapy Treatment Patient Details Name: Jeremy Burns MRN: [id number] DOB: 1975/07/21 Today's Date: 04/29/2021   History of present illness 45 year old male who presented to [facility] after found down in his tractor-trailer and EMS witnessed a 45-second episode of seizure activity with bilateral upper extremity general tonic-clonic behavior, resolved after about 2 minutes followed by inability to raise his right arm. MRI revealed multiple enhancing parenchymal and leptomeningeal lesion in the left  frontoparietal and insular region. PMHx: none on file   OT comments  Patient received in bed and willing to participate with OT treatment. Patient appeared impulsive attempting to get out of bed before rail was down or covers removed. Patient performed standing from eob for light grooming and to address maintaining attention while performing tasks. Transportation arrived during treatment to take patient for further testing and patient returned to supine. Acute OT to continue to follow.    Recommendations for follow up therapy are one component of a multi-disciplinary discharge planning process, led by the attending physician.  Recommendations may be updated based on patient status, additional functional criteria and insurance authorization.    Follow Up Recommendations  Outpatient OT    Assistance Recommended at Discharge Intermittent Supervision/Assistance  Equipment Recommendations  None recommended by OT    Recommendations for Other Services      Precautions / Restrictions Precautions Precautions: Fall Precaution Comments: seizure Restrictions Weight Bearing Restrictions: No       Mobility Bed Mobility Overal bed mobility: Modified Independent             General bed mobility comments: HOB slightly elevated & use of rail    Transfers Overall transfer level: Modified independent Equipment used: None Transfers: Sit to/from Stand             General transfer  comment: able to stand from eob without assistance     Balance Overall balance assessment: Needs assistance Sitting-balance support: Feet supported Sitting balance-Leahy Scale: Normal     Standing balance support: No upper extremity supported Standing balance-Leahy Scale: Good                             ADL either performed or assessed with clinical judgement   ADL Overall ADL's : Needs assistance/impaired     Grooming: Min guard;Standing Grooming Details (indicate cue type and reason): stood from [employer]     Praxis      Cognition Arousal/Alertness: Awake/alert Behavior During Therapy: Impulsive Overall Cognitive Status: Impaired/Different from baseline Area of Impairment: Attention;Memory;Following commands;Safety/judgement;Problem solving                   Current Attention Level: Selective Memory: Decreased short-term memory Following Commands: Follows one step commands consistently;Follows multi-step commands inconsistently Safety/Judgement: Decreased awareness of safety;Decreased awareness of deficits   Problem Solving: Requires verbal cues General Comments: followed commands well          Exercises     Shoulder Instructions       General Comments Addressed maintaining attention while standing    Pertinent Vitals/ Pain       Pain Assessment: No/denies pain  Home Living  Prior Functioning/Environment              Frequency  Min 2X/week        Progress Toward Goals  OT Goals(current goals can now be found in the care plan section)  Progress towards OT goals: Progressing toward goals  Acute Rehab OT Goals OT Goal Formulation: With patient Time For Goal Achievement: 05/11/21 Potential to Achieve Goals: Fair ADL Goals Pt Will Perform Grooming: Independently;standing Pt Will Transfer to  Toilet: Independently;ambulating Additional ADL Goal #1: Pt will demonstrated ability to sustain divided attention during ADL tasks with minimal verbal cues Additional ADL Goal #2: Pt will indep demonstrated at least one cognitive compensatory technique to promote accuracy in ADL/IADLs  Plan Discharge plan remains appropriate    Co-evaluation                 AM-PAC OT "6 Clicks" Daily Activity     Outcome Measure   Help from another person eating meals?: None Help from another person taking care of personal grooming?: A Little Help from another person toileting, which includes using toliet, bedpan, or urinal?: A Little Help from another person bathing (including washing, rinsing, drying)?: A Little Help from another person to put on and taking off regular upper body clothing?: None Help from another person to put on and taking off regular lower body clothing?: A Little 6 Click Score: 20    End of Session Equipment Utilized During Treatment: Gait belt  OT Visit Diagnosis: Muscle weakness (generalized) (M62.81);Other symptoms and signs involving cognitive function   Activity Tolerance Patient tolerated treatment well   Patient Left in bed;with bed alarm set;with call bell/phone within reach;with nursing/sitter in room   Nurse Communication Mobility status        Time: 3016-0109 OT Time Calculation (min): 12 min  Charges: OT General Charges $OT Visit: 1 Visit OT Treatments $Therapeutic Activity: 8-22 mins  Lodema Hong, [place]  Pager [phone] Office [place] 04/29/2021, 3:07 PM

## 2021-04-29 NOTE — Progress Notes (Addendum)
Subjective: No acute events overnight.  States he is feeling fine.  Denies any concerns.  ROS: negative except above  Examination  Vital signs in last 24 hours: Temp:  [97.7 F (36.5 C)-98.3 F (36.8 C)] 98.3 F (36.8 C) (10/28 1240) Pulse Rate:  [69-74] 71 (10/28 1240) Resp:  [16-18] 18 (10/28 1240) BP: (101-113)/(67-81) 101/67 (10/28 1240) SpO2:  [96 %-100 %] 96 % (10/28 1240)  General: lying in bed, NAD CVS: pulse-normal rate and rhythm RS: breathing comfortably, CTAB Extremities: normal, warm   Neuro: MS: Alert, oriented, follows commands CN: pupils equal and reactive,  EOMI, face symmetric, tongue midline, normal sensation over face, Motor: 5/5 strength in all 4 extremities Reflexes: 2+ bilaterally over patella, biceps, plantars: flexor Coordination: normal Gait: not tested  Basic Metabolic Panel: Recent Labs  Lab 04/26/21 0959 04/26/21 0959 04/26/21 1002 04/26/21 1009 04/26/21 1344 04/26/21 1445 04/27/21 0343 04/28/21 0245  NA 133*  --  131* 132*  --  135 138 133*  K 4.3  --  4.4 4.3  --  4.5 3.5 3.4*  CL 98  --  96*  --   --  104 106 105  CO2  --   --  23  --   --  19* 24 22  GLUCOSE 636*  --  610*  --   --  273* 211* 283*  BUN 12  --  11  --   --  10 11 11   CREATININE 1.00  --  1.17  --   --  0.91 0.89 0.98  CALCIUM  --    < > 9.1  --   --  8.9 8.9 8.5*  MG  --   --   --   --  2.2  --   --   --   PHOS  --   --   --   --  3.0  --   --   --    < > = values in this interval not displayed.    CBC: Recent Labs  Lab 04/26/21 0959 04/26/21 1002 04/26/21 1009 04/27/21 0343 04/28/21 0245  WBC  --  6.0  --  6.7 7.4  NEUTROABS  --  4.4  --  4.0  --   HGB 16.7 15.6 16.7 14.4 14.0  HCT 49.0 47.0 49.0 42.9 43.0  MCV  --  90.0  --  89.6 91.1  PLT  --  286  --  274 239     Coagulation Studies: Recent Labs    04/28/21 1025  LABPROT 12.7  INR 1.0    Imaging No new brain imaging overnight  ASSESSMENT AND PLAN: 45 year old male with past medical  history of diabetes type 2, combined systolic and diastolic congestive), coronary artery disease, AICD placement, CKD stage I, mixed hyperlipidemia, hypertension who was found down by EMS.  He had a 45-second seizure in route and another 2-minute seizure in the ED described as right-sided twitching and loss of consciousness.  Also reported having trouble writing with his right hand as well as episodes of staring off since morning. EEG showed seizures arising from left frontal-anterior temporal region.  MRI brain with and without contrast showed multiple enhancing parenchymal and leptomeningeal lesion in the left frontoparietal and insular region, nonspecific. Differential diagnosis include leptomeningeal processes such as infection and granulomatous disease versus malignancy. No significant mass effect or hemorrhage.   Focal seizures, new onset Brain lesions -This study showed one seizure on 04/28/2021 at 0654, lasting about 1.5 minutes  during which patient had no clinical signs.Seizure was arising from left frontal-anterior temporal region     Recommendations - As patient had 1 more seizure overnight, will increase Keppra to 1500 mg twice daily - CT chest, abod and pelvis w contrast for malignancy workup pending - LP performed under fluoro guidance, no pleocytosis, elevated protein at 104 and glucose 148, neg cryptococcal Ag, HSV PCR, VZV PCR, JC virus PCR, VDRL CSF IgG, myelin basic protein, ACE, CSF oligoclonal bands, serum oligoclonal bands Gram stain pending -If CT C/A/P and lumbar puncture unremarkable, will likely need to consult ID and neurosurgery to look for atypical infections and possible biopsy - Continue LTM EEG until patient is seizure-free for about 24 hours -If patient has frequent seizures, can  load with Vimpat -management of rest of comorbidities per primary team   I have spent a total of 35 minutes with the patient reviewing hospital notes,  test results, labs and examining the  patient as well as establishing an assessment and plan that was discussed personally with the patient and Dr Broadus John.  > 50% of time was spent in direct patient care.   Zeb Comfort Epilepsy [facility] For questions after 5pm please refer to AMION to reach the Neurologist on call

## 2021-04-30 DIAGNOSIS — R933 Abnormal findings on diagnostic imaging of other parts of digestive tract: Secondary | ICD-10-CM

## 2021-04-30 DIAGNOSIS — R9089 Other abnormal findings on diagnostic imaging of central nervous system: Secondary | ICD-10-CM

## 2021-04-30 DIAGNOSIS — R569 Unspecified convulsions: Principal | ICD-10-CM

## 2021-04-30 LAB — BASIC METABOLIC PANEL
Anion gap: 10 (ref 5–15)
BUN: 9 mg/dL (ref 6–20)
CO2: 23 mmol/L (ref 22–32)
Calcium: 9 mg/dL (ref 8.9–10.3)
Chloride: 101 mmol/L (ref 98–111)
Creatinine, Ser: 0.95 mg/dL (ref 0.61–1.24)
GFR, Estimated: >60 mL/min (ref >60)
Glucose, Bld: 187 mg/dL — ABNORMAL HIGH (ref 70–99)
Potassium: 4.1 mmol/L (ref 3.5–5.1)
Sodium: 134 mmol/L — ABNORMAL LOW (ref 135–145)

## 2021-04-30 LAB — CBC
HCT: 44.3 % (ref 39.0–52.0)
Hemoglobin: 14.5 g/dL (ref 13.0–17.0)
MCH: 29.7 pg (ref 26.0–34.0)
MCHC: 32.7 g/dL (ref 30.0–36.0)
MCV: 90.8 fL (ref 80.0–100.0)
Platelets: 251 10*3/uL (ref 150–400)
RBC: 4.88 MIL/uL (ref 4.22–5.81)
RDW: 13.2 % (ref 11.5–15.5)
WBC: 7.2 10*3/uL (ref 4.0–10.5)
nRBC: 0 % (ref 0.0–0.2)

## 2021-04-30 LAB — LACTATE DEHYDROGENASE: LDH: 133 U/L (ref 98–192)

## 2021-04-30 LAB — GLUCOSE, CAPILLARY
Glucose-Capillary: 192 mg/dL — ABNORMAL HIGH (ref 70–99)
Glucose-Capillary: 214 mg/dL — ABNORMAL HIGH (ref 70–99)
Glucose-Capillary: 149 mg/dL — ABNORMAL HIGH (ref 70–99)
Glucose-Capillary: 261 mg/dL — ABNORMAL HIGH (ref 70–99)

## 2021-04-30 MED ORDER — PEG-KCL-NACL-NASULF-NA ASC-C 100 G PO SOLR: 1.0000 | Freq: Once | ORAL | Status: DC

## 2021-04-30 MED ORDER — PEG-KCL-NACL-NASULF-NA ASC-C 100 G PO SOLR
0.5000 | Freq: Once | ORAL | Status: AC
  Administered 2021-05-02: 100 g via ORAL

## 2021-04-30 MED ORDER — PEG-KCL-NACL-NASULF-NA ASC-C 100 G PO SOLR
0.5000 | Freq: Once | ORAL | Status: AC
  Administered 2021-05-01: 100 g via ORAL

## 2021-04-30 MED ORDER — PEG-KCL-NACL-NASULF-NA ASC-C 100 G PO SOLR
0.5000 | Freq: Once | ORAL | Status: DC
  Filled 2021-04-30: qty 1

## 2021-04-30 MED ORDER — ENOXAPARIN SODIUM 40 MG/0.4ML IJ SOSY
40.0000 mg | PREFILLED_SYRINGE | INTRAMUSCULAR | Status: DC
  Administered 2021-04-30 – 2021-05-03 (×4): 40 mg via SUBCUTANEOUS
  Filled 2021-04-30 (×4): qty 0.4

## 2021-04-30 MED ORDER — PEG-KCL-NACL-NASULF-NA ASC-C 100 G PO SOLR: 0.5000 | Freq: Once | ORAL | Status: DC

## 2021-04-30 NOTE — Progress Notes (Signed)
LTM maintained. A1 lead high impedance, tech will fix. No skin breakdown seen. Results pending.

## 2021-04-30 NOTE — Progress Notes (Signed)
Occupational Therapy Treatment Patient Details Name: Jeremy Burns MRN: [id number] DOB: 02/02/1976 Today's Date: 04/30/2021   History of present illness 45 year old male who presented to [facility] after found down in his tractor-trailer and EMS witnessed a 45-second episode of seizure activity with bilateral upper extremity general tonic-clonic behavior, resolved after about 2 minutes followed by inability to raise his right arm. MRI revealed multiple enhancing parenchymal and leptomeningeal lesion in the left  frontoparietal and insular region. PMHx: none on file   OT comments  Patient performed grooming standing from eob due to monitoring equipment.  Patient assigned tasks for grooming and was able to perform all tasks with attention divided without verbal cues to correct.  Discussed safety with self care and seizures.  Performed static standing from eob with supervision. Acute OT to continue to follow.    Recommendations for follow up therapy are one component of a multi-disciplinary discharge planning process, led by the attending physician.  Recommendations may be updated based on patient status, additional functional criteria and insurance authorization.    Follow Up Recommendations  Outpatient OT    Assistance Recommended at Discharge Intermittent Supervision/Assistance  Equipment Recommendations  None recommended by OT    Recommendations for Other Services      Precautions / Restrictions Precautions Precautions: Fall Precaution Comments: seizure Restrictions Weight Bearing Restrictions: No       Mobility Bed Mobility Overal bed mobility: Modified Independent             General bed mobility comments: able to get in and out of bed without assistance    Transfers                         Balance Overall balance assessment: Needs assistance Sitting-balance support: Feet supported Sitting balance-Leahy Scale: Normal     Standing balance support: No upper  extremity supported Standing balance-Leahy Scale: Good Standing balance comment: supervision while standing due to history of seizures                           ADL either performed or assessed with clinical judgement   ADL Overall ADL's : Needs assistance/impaired Eating/Feeding: Independent;Sitting   Grooming: Wash/dry hands;Wash/dry face;Applying deodorant;Supervision/safety;Standing Grooming Details (indicate cue type and reason): able to perform grooming tasks with attention divided                             Functional mobility during ADLs: Supervision/safety General ADL Comments: performed grooming standing from eob due to monitoring equipment     Vision       Perception     Praxis      Cognition Arousal/Alertness: Awake/alert Behavior During Therapy: Impulsive Overall Cognitive Status: Impaired/Different from baseline Area of Impairment: Attention;Memory;Following commands;Safety/judgement;Problem solving                   Current Attention Level: Selective Memory: Decreased short-term memory Following Commands: Follows one step commands consistently;Follows multi-step commands inconsistently Safety/Judgement: Decreased awareness of safety;Decreased awareness of deficits   Problem Solving: Requires verbal cues General Comments: decreased impulsiveness          Exercises     Shoulder Instructions       General Comments      Pertinent Vitals/ Pain       Pain Assessment: No/denies pain  Home Living  Prior Functioning/Environment              Frequency  Min 2X/week        Progress Toward Goals  OT Goals(current goals can now be found in the care plan section)  Progress towards OT goals: Progressing toward goals  Acute Rehab OT Goals Patient Stated Goal: get better OT Goal Formulation: With patient Time For Goal Achievement: 05/11/21 Potential to  Achieve Goals: Fair ADL Goals Pt Will Perform Grooming: Independently;standing Pt Will Transfer to Toilet: Independently;ambulating Additional ADL Goal #1: Pt will demonstrated ability to sustain divided attention during ADL tasks with minimal verbal cues Additional ADL Goal #2: Pt will indep demonstrated at least one cognitive compensatory technique to promote accuracy in ADL/IADLs  Plan Discharge plan remains appropriate    Co-evaluation                 AM-PAC OT "6 Clicks" Daily Activity     Outcome Measure   Help from another person eating meals?: None Help from another person taking care of personal grooming?: A Little Help from another person toileting, which includes using toliet, bedpan, or urinal?: A Little Help from another person bathing (including washing, rinsing, drying)?: A Little Help from another person to put on and taking off regular upper body clothing?: None Help from another person to put on and taking off regular lower body clothing?: A Little 6 Click Score: 20    End of Session    OT Visit Diagnosis: Muscle weakness (generalized) (M62.81);Other symptoms and signs involving cognitive function   Activity Tolerance Patient tolerated treatment well   Patient Left in bed;with bed alarm set;with call bell/phone within reach;with nursing/sitter in room   Nurse Communication Mobility status        Time: 3790-2409 OT Time Calculation (min): 29 min  Charges: OT General Charges $OT Visit: 1 Visit OT Treatments $Self Care/Home Management : 8-22 mins $Therapeutic Activity: 8-22 mins  Lodema Hong, [place]  Pager [phone] Office [place] 04/30/2021, 2:52 PM

## 2021-04-30 NOTE — Plan of Care (Signed)
  Problem: Education: Goal: Knowledge of General Education information will improve Description: Including pain rating scale, medication(s)/side effects and non-pharmacologic comfort measures Outcome: Progressing   Problem: Health Behavior/Discharge Planning: Goal: Ability to manage health-related needs will improve Outcome: Progressing   Problem: Clinical Measurements: Goal: Ability to maintain clinical measurements within normal limits will improve Outcome: Progressing   Problem: Activity: Goal: Risk for activity intolerance will decrease Outcome: Progressing   Problem: Nutrition: Goal: Adequate nutrition will be maintained Outcome: Progressing   Problem: Elimination: Goal: Will not experience complications related to urinary retention Outcome: Progressing   Problem: Pain Managment: Goal: General experience of comfort will improve Outcome: Progressing   Problem: Safety: Goal: Ability to remain free from injury will improve Outcome: Progressing   Problem: Skin Integrity: Goal: Risk for impaired skin integrity will decrease Outcome: Progressing   

## 2021-04-30 NOTE — Progress Notes (Addendum)
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 -In the emergency room he was noted to have CBG greater than 600 without DKA -Seen by neurology, loaded with Keppra, EEG ordered   Assessment & Plan:  New onset seizures Abnormal MRI-multiple enhancing parenchymal lesions -EEG abnormal noted epileptiform activity arising from left frontal anterior temporal region -Neurology following, continue Keppra, LTM with 1 seizure overnight, Keppra dose increased -Clinically much improved, stable on current dose -MRI brain concerning for multiple enhancing parenchymal lesions, etiology is unclear -HIV negative,  -Neurology following, LP noted increased CSF glucose and protein, viral studies pending -CT chest abdomen pelvis with contrast noted focal thickening in the hepatic flexure of colon, could be peristalsis related, requested GI eval for colonoscopy to complete work-up   Uncontrolled type 2 diabetes mellitus Hyperosmolar nonketotic hyperglycemia -CBG> 600 on admission, without DKA, patient does not remember missing any insulin doses -At baseline on Levemir 55 units twice daily and NovoLog 3 times daily with meals,  -CBGs improving on increase Semglee dose, continue NovoLog with meals -HbA1c> 15.5 -Metformin on hold  History of CAD History of chronic systolic and diastolic CHF with AICD -EF unknown -Clinically euvolemic at this time, continue carvedilol, amiodarone, statin -Lasix and Aldactone on hold, resume low-dose Lasix tomorrow if BP tolerates  R lung  nodule -needs FU  Hypertension -BP soft, hold Cozaar  DVT prophylaxis: Restart Lovenox Code Status: Full code Family Communication: no family at bedside, left msg for Simon Rhein [phone] -per pts request Disposition Plan:  Status is: Inpatient  Remains inpatient appropriate because: Severity of illness   Consultants:  Neurology IR  Procedures: Lumbar puncture 10/28  Antimicrobials:    Subjective: -Patient feels better today, denies any events overnight, stable on current dose of Keppra  Objective: Vitals:   04/29/21 2043 04/30/21 0011 04/30/21 0407 04/30/21 0900  BP: 1_0 111/80  Pulse: 67 (!) 59 66 71  Resp: _1 Temp: 98.5 F (36.9 C) 97.7 F (36.5 C) (!) 97.3 F (36.3 C) 98.3 F (36.8 C)  TempSrc: Oral Oral Oral Oral  SpO2: 98% 99% 96% 99%  Weight:      Height:        Intake/Output Summary (Last 24 hours) at 04/30/2021 1135 Last data filed at 04/30/2021 0900 Gross per 24 hour  Intake 537 ml  Output 2300 ml  Net -1763 ml   Filed Weights   04/26/21 1700 04/27/21 1513  Weight: 42.6 kg 98.1 kg    Examination:  General exam: Pleasant averagely built male sitting up in bed, AAOx3, no distress, no cognitive delay today CVS: S1-S2, regular rate rhythm Lungs: Clear bilaterally Abdomen: Soft, nontender, bowel sounds present Extremities: No edema Skin: No rash on exposed skin Psychiatry: Mood & affect appropriate.     Data Reviewed:   CBC: Recent Labs  Lab 04/26/21 1002 04/26/21 1009 04/27/21 0343 04/28/21 0245 04/30/21 0618  WBC 6.0  --  6.7 7.4 7.2  NEUTROABS 4.4  --  4.0  --   --   HGB 15.6 16.7 14.4 14.0 14.5  HCT 47.0 49.0 42.9 43.0 44.3  MCV 90.0  --  89.6 91.1 90.8  PLT 286  --  274 239 793   Basic Metabolic Panel: Recent Labs  Lab 04/26/21 1002 04/26/21 1009 04/26/21 1344 04/26/21 1445 04/27/21 0343 04/28/21 0245 04/30/21 0618  NA 131* 132*  --  135 138 133* 134*  K 4.4 4.3  --  4.5 3.5 3.4* 4.1   CL 96*  --   --  104 106 105 101  CO2 23  --   --  19* _0 GLUCOSE 610*  --   --  273* 211* 283* 187*  BUN 11  --   --  _1 CREATININE 1.17  --   --  0.91 0.89 0.98 0.95  CALCIUM 9.1  --   --  8.9 8.9 8.5* 9.0  MG  --   --  2.2  --   --   --   --   PHOS  --   --  3.0  --   --   --   --    GFR: Estimated Creatinine Clearance: 116.5 mL/min (by C-G formula based on SCr of 0.95 mg/dL). Liver Function Tests: Recent Labs  Lab 04/26/21 1002  AST 19  ALT 12  ALKPHOS 84  BILITOT 0.8  PROT 7.4  ALBUMIN 3.6   No results for input(s): LIPASE, AMYLASE in the last 168 hours. No results for input(s): AMMONIA in the last 168 hours. Coagulation Profile: Recent Labs  Lab 04/26/21 1002 04/28/21 1025  INR 1.0 1.0   Cardiac Enzymes: No results for input(s): CKTOTAL, CKMB, CKMBINDEX, TROPONINI in the last 168 hours. BNP (last 3 results) No results for input(s): PROBNP in the last 8760 hours. HbA1C: No results for input(s): HGBA1C in the last 72 hours.  CBG: Recent Labs  Lab 04/29/21 0613 04/29/21 1239 04/29/21 1718 04/29/21 2120 04/30/21 0626  GLUCAP 271* 283* 243* 147* 192*   Lipid Profile: No results for input(s): CHOL, HDL, LDLCALC, TRIG, CHOLHDL, LDLDIRECT in the last 72 hours.  Thyroid Function Tests: No results for input(s): TSH, T4TOTAL, FREET4, T3FREE, THYROIDAB in the last 72 hours. Anemia Panel: No results for input(s): VITAMINB12, FOLATE, FERRITIN, TIBC, IRON, RETICCTPCT in the last 72 hours. Urine analysis:    Component Value Date/Time   COLORURINE STRAW (A) 04/26/2021 1030   APPEARANCEUR CLEAR 04/26/2021 1030   LABSPEC 1.026 04/26/2021 1030   PHURINE 6.0 04/26/2021 1030   GLUCOSEU >=500 (A) 04/26/2021 1030   HGBUR NEGATIVE 04/26/2021 1030   [place] 04/26/2021 1030   [place] 04/26/2021 1030   PROTEINUR 100 (A) 04/26/2021 1030   NITRITE NEGATIVE 04/26/2021 1030   LEUKOCYTESUR NEGATIVE 04/26/2021 1030   Sepsis  Labs: _2 (procalcitonin:4,lacticidven:4)  ) Recent Results (from the past 240 hour(s))  CSF culture w Gram Stain     Status: None (Preliminary result)   Collection Time: 04/29/21 10:29 AM   Specimen: CSF  Result Value Ref Range Status   Specimen Description CSF  Final   Special Requests NONE  Final   Gram Stain   Final    WBC PRESENT, PREDOMINANTLY MONONUCLEAR NO ORGANISMS SEEN CYTOSPIN SMEAR    Culture   Final    NO GROWTH < 24 HOURS Performed at [facility], [place] [address] [place], [place] [address]    Report Status PENDING  Incomplete      Radiology Studies: CT CHEST ABDOMEN PELVIS W CONTRAST  Result Date: 04/29/2021 CLINICAL  DATA:  Cancer of unknown primary, surveillance EXAM: CT CHEST, ABDOMEN, AND PELVIS WITH CONTRAST TECHNIQUE: Multidetector CT imaging of the chest, abdomen and pelvis was performed following the standard protocol during bolus administration of intravenous contrast. CONTRAST:  124m OMNIPAQUE IOHEXOL 350 MG/ML SOLN COMPARISON:  None. FINDINGS: CT CHEST FINDINGS Cardiovascular: Markedly dilated left ventricle. There is a dual lead pacemaker/AICD with leads in the right atrium and right ventricle.No pericardial disease.Normal size main and branch pulmonary arteries. There is no evidence of central pulmonary embolism.Common origin of the brachiocephalic and left common carotid arteries. Mediastinum/Nodes: No lymphadenopathy.The thyroid is unremarkable.Esophagus is unremarkable.The trachea is unremarkable. Lungs/Pleura: There is interlobular septal thickening and bilateral ground-glass opacities.No suspicious pulmonary nodules or masses.No pleural effusion.No pneumothorax. Musculoskeletal: No acute osseous abnormality.There is a 3 mm peripheral right apical subpleural solid pulmonary nodule (series 4, image 38). Focal skin thickening of the left axilla (series 3, image 7). CT ABDOMEN PELVIS FINDINGS Hepatobiliary: No focal liver abnormality is seen. No  gallstones, gallbladder wall thickening, or biliary dilatation. Pancreas: Unremarkable. No pancreatic ductal dilatation or surrounding inflammatory changes. Spleen: Normal in size without focal abnormality. Adrenals/Urinary Tract: Adrenal glands are unremarkable. Bilateral focal renal cortical thinning/scarring. No hydronephrosis. No nephrolithiasis. No renal mass. The bladder is unremarkable. Stomach/Bowel: The stomach is within normal limits. There is no evidence of bowel obstruction. The appendix is normal. There is focal luminal narrowing and wall thickening within the ascending colon near the hepatic flexure (coronal images 80-83), with relatively preserved haustral folds. Vascular/Lymphatic: No significant vascular findings are present. No enlarged abdominal or pelvic lymph nodes. Reproductive: Unremarkable. Other: No bowel containing hernia.  No abdominopelvic ascites. Musculoskeletal: No acute osseous abnormality. No suspicious lytic or blastic lesions. There is a bone island in the left superior pubic ramus. IMPRESSION: Focal luminal narrowing and wall thickening within the ascending colon near the hepatic flexor. While this could represent focal peristalsis, recommend correlation with colonoscopy, if not recently performed, to rule out underlying colonic neoplasm. Focal skin thickening near the left axilla, recommend correlation with direct visualization. Markedly dilated left ventricle.  Mild pulmonary edema. Solid 3 mm right apical subpleural pulmonary nodule, of uncertain clinical significance. Electronically Signed   By: JMaurine SimmeringM.D.   On: 04/29/2021 15:11   DG FL GUIDED LUMBAR PUNCTURE  Result Date: 04/29/2021 CLINICAL DATA:  Provided history: Seizure. EXAM: DIAGNOSTIC LUMBAR PUNCTURE UNDER FLUOROSCOPIC GUIDANCE COMPARISON:  Brain MRI 04/27/2021. FLUOROSCOPY TIME:  Fluoroscopy Time:  30 seconds Radiation Exposure Index (if provided by the fluoroscopic device): 4.8 mGy Number of Acquired Spot  Images: None PROCEDURE: Informed consent was obtained from the patient prior to the procedure by JSoyla Dryer NP, following a discussion of procedural risks, benefits and alternatives. With the patient prone, the lower back was prepped with Betadine. 1% Lidocaine was used for local anesthesia. Lumbar puncture was performed at the L4-L5 level using a 20 gauge needle with return of clear CSF with an opening pressure of 20 cm water. 13 ml of CSF were obtained for laboratory studies. The patient tolerated the procedure well without immediate post-procedure complication. The procedure was performed by JSoyla Dryer NP. IMPRESSION: Technically successful fluoroscopically-guided lumbar puncture at the L4-L5 level. Opening pressure of 20 cm water. 13 mL of CSF obtained for laboratory studies. The patient tolerated the procedure well without immediate post-procedure complication. Electronically Signed   By: KKellie SimmeringD.O.   On: 04/29/2021 11:22    Scheduled Meds:   stroke: mapping our early stages of recovery book  Does not apply Once   amiodarone  200 mg Oral Daily   aspirin EC  81 mg Oral Daily   atorvastatin  80 mg Oral Daily   carvedilol  25 mg Oral BID WC   chlorhexidine gluconate (MEDLINE KIT)  15 mL Mouth Rinse BID   insulin aspart  0-15 Units Subcutaneous TID WC   insulin aspart  0-5 Units Subcutaneous QHS   insulin aspart  3 Units Subcutaneous TID WC   insulin glargine-yfgn  45 Units Subcutaneous Daily   levETIRAcetam  1,500 mg Oral BID   mouth rinse  15 mL Mouth Rinse 10 times per day   Continuous Infusions:  sodium chloride 10 mL/hr at 04/28/21 0815     LOS: 4 days    Time spent: 55mn  PDomenic Polite MD [facility]   04/30/2021, 11:35 AM

## 2021-04-30 NOTE — Procedures (Addendum)
Patient Name: Bear Osten  MRN: [id number]  Epilepsy Attending: Lora Havens  Referring Physician/Provider: Dr Donnetta Simpers Duration: 04/29/2021 1801 to 1029/2022 1801   Patient history: 45 year old male with 2 minutes of seizure activity described as right-sided twitching with right gaze deviation followed by right-sided weakness.  EEG to evaluate for seizures.   Level of alertness: Awake, asleep   AEDs during EEG study: LEV   Technical aspects: This EEG study was done with scalp electrodes positioned according to the 10-20 International system of electrode placement. Electrical activity was acquired at a sampling rate of 500Hz  and reviewed with a high frequency filter of 70Hz  and a low frequency filter of 1Hz . EEG data were recorded continuously and digitally stored.    Description: The posterior dominant rhythm consists of 9 Hz activity of moderate voltage (25-35 uV) seen predominantly in posterior head regions, symmetric and reactive to eye opening and eye closing.  Sleep was characterized by meticulous, sleep spindles (12 to 14 Hz), maximal frontocentral region. Intermittent 3-5hz  theta-delta slowing was noted in left frontal-anterior temporal region  ABNORMALITY -Intermittent slow,  left frontal-anterior temporal region   IMPRESSION: This study is suggestive of cortical dysfunction in left frontal-anterior temporal region, likely secondary to underlying lesions. No seizures were seen during this study.   Tiffanni Scarfo Barbra Sarks

## 2021-04-30 NOTE — Consult Note (Signed)
[facility] Consult: 11:38 AM 04/30/2021  LOS: 4 days    Referring Provider: Dr Fanny Bien  Primary Care Physician:  Jon Billings MD in [facility]. Primary Gastroenterologist:  unassigned pt from out of town   Reason for Consultation:  thickening of colon at hepatic flexure   HPI: Jeremy Burns is a 45 y.o. male.  Residence is in [place], [place].  PMH CHF, CAD, cardiomyopathy.  Status post AICD.  CKD 2.  IDDM.  Recent hemoglobin A1c of 14.  HLD.  Hypertension  Patient is a regional, long-distance truck driver.  Happened to be in [place] when events occurred. Admitted to [facility] after presenting with prolonged seizures, glucose 636.  MRI brain shows multiple brain lesions, differential includes leptomeningeal process (infection, granulomatous disease) versus malignancy. CTAP and chest with contrast shows focal narrowing and wall thickening in ascending colon in region of hepatic flexure.  Suggest rule out underlying colonic neoplasm.  Other non-GI findings included dilated left ventricle, mild pulmonary edema and 3 mm right apical, subpleural pulmonary nodule. Patient is not anemic, CBC entirely normal. Chemistries with sodium as low as 133.  LFTs are normal.  No previous EGD or colonoscopy or need for [facility] specialty care.  Moves Kutzer stools once or twice a day.  Has never seen bloody stools good food tolerance without nausea, vomiting, heartburn, dysphagia.  Stable weight.  Last seizure per his recall was yesterday.  Family history of extensive heart problems in his father and paternal side of his family.  Paternal uncle diagnosed in his late 70s versus early 59s with advanced stage colon cancer, still battling this after 2 or 3 years.  Patient is not sure if his father  has undergone colonoscopy.  Father has some sort of prostate issues. Lives at home with his 48 year old daughter and a friend.      Prior to Admission medications   Medication Sig Start Date End Date Taking? Authorizing Provider  amiodarone (PACERONE) 200 MG tablet Take 200 mg by mouth daily. 11/05/20  Yes [provider]  aspirin EC 81 MG tablet Take 81 mg by mouth daily. Swallow whole.   Yes [provider]  atorvastatin (LIPITOR) 80 MG tablet Take 80 mg by mouth daily. 11/05/20  Yes [provider]  carvedilol (COREG) 25 MG tablet Take 25 mg by mouth 2 (two) times daily. 11/05/20  Yes [provider]  furosemide (LASIX) 40 MG tablet Take 40 mg by mouth 2 (two) times daily. 11/05/20  Yes [provider]  Insulin Regular Human (NOVOLIN R FLEXPEN RELION) 100 UNIT/ML KwikPen Inject 10 Units as directed with breakfast, with lunch, and with evening meal.   Yes [provider]  LEVEMIR FLEXTOUCH 100 UNIT/ML FlexTouch Pen Inject 55 Units into the skin 2 (two) times daily. 04/23/21  Yes [provider]  losartan (COZAAR) 100 MG tablet Take 100 mg by mouth daily. 02/22/21  Yes [provider]  metFORMIN (GLUCOPHAGE-XR) 500 MG 24 hr tablet Take 500 mg by mouth every morning. 11/05/20  Yes [provider]  Multiple Vitamin (MULTIVITAMIN WITH MINERALS) TABS tablet Take 1 tablet by mouth daily.   Yes [provider]  Potassium Chloride ER 20 MEQ TBCR Take 40 mg by mouth daily. 02/20/21  Yes [provider]  spironolactone (ALDACTONE) 25 MG tablet Take 25 mg by mouth daily. 03/08/21  Yes [provider]    Scheduled Meds:   stroke: mapping our early stages of recovery book   Does not apply Once   amiodarone  200 mg Oral Daily   aspirin EC  81 mg Oral Daily   atorvastatin  80 mg Oral Daily   carvedilol  25 mg Oral BID WC   chlorhexidine gluconate (MEDLINE KIT)  15 mL Mouth Rinse BID   insulin aspart  0-15  Units Subcutaneous TID WC   insulin aspart  0-5 Units Subcutaneous QHS   insulin aspart  3 Units Subcutaneous TID WC   insulin glargine-yfgn  45 Units Subcutaneous Daily   levETIRAcetam  1,500 mg Oral BID   mouth rinse  15 mL Mouth Rinse 10 times per day   Infusions:  sodium chloride 10 mL/hr at 04/28/21 0815   PRN Meds: influenza vac split quadrivalent PF, LORazepam, ondansetron **OR** ondansetron (ZOFRAN) IV   Allergies as of 04/26/2021   (No Known Allergies)    No family history on file.  Social History   Socioeconomic History   Marital status: Single    Spouse name: Not on file   Number of children: Not on file   Years of education: Not on file   Highest education level: Not on file  Occupational History   Not on file  Tobacco Use   Smoking status: Not on file   Smokeless tobacco: Not on file  Substance and Sexual Activity   Alcohol use: Not on file   Drug use: Not on file   Sexual activity: Not on file  Other Topics Concern   Not on file  Social History Narrative   Not on file   Social Determinants of Health   Financial Resource Strain: Not on file  Food Insecurity: Not on file  Transportation Needs: Not on file  Physical Activity: Not on file  Stress: Not on file  Social Connections: Not on file  Intimate Partner Violence: Not on file    REVIEW OF SYSTEMS: Constitutional: Generally no weakness or fatigue.  With the seizures he feels out of it. ENT:  No nose bleeds Pulm: No shortness of breath or cough. CV:  No palpitations, no LE edema.  GU:  No hematuria, no frequency GI: See HPI. Heme: No history of unusual or excessive bleeding or bruising. Transfusions: No previous transfusions. Neuro: Seizures as above.  Currently on continuous EEG monitoring.  Did have a headache a few days prior to onset of seizures. Derm:  No itching, no rash or sores.  Endocrine:  No sweats or chills.  No polyuria or dysuria Immunization: Reviewed. Travel: Recently was  in [place] for a delivery.   PHYSICAL EXAM: Vital signs in last 24 hours: Vitals:   04/30/21 0407 04/30/21 0900  BP: 96/69 111/80  Pulse: 66 71  Resp: 17 18  Temp: (!) 97.3 F (36.3 C) 98.3 F (36.8 C)  SpO2: 96% 99%   Wt Readings from Last 3 Encounters:  04/27/21 98.1 kg    General: Pleasant, alert, comfortable, not ill appearing, overweight. Head: No facial asymmetry or swelling.  No signs of head trauma.  EEG monitor in place. Eyes: No nystagmus.  No scleral icterus or conjunctival pallor.  EOMI Ears: Not hard of hearing Nose: No congestion or discharge Mouth: Oral mucosa moist, pink, clear.  Tongue midline.  Edentulous. Neck: No JVD, no masses, no thyromegaly Lungs: Clear bilaterally.  No labored breathing or cough. Heart: RRR.  No MRG.  S1, S2 present. Abdomen: Obese but soft.  Active bowel sounds.  Not tender.  No HSM, masses, bruits, hernias..   Rectal: Deferred. Musc/Skeltl: No gross joint deformities or contractures. Extremities: No CCE. Neurologic: No tremors.  No limb weakness.  Moves all 4 limbs easily.  Speech is fluid.  No gross seizure activity. Skin: No rash, no sores, no suspicious lesions. Nodes: No cervical adenopathy Psych: Calm, pleasant, in good spirits.  Intake/Output from previous day: 10/28 0701 - 10/29 0700 In: 177 [P.O.:177] Out: 1650 [Urine:1650] Intake/Output this shift: Total I/O In: 360 [P.O.:360] Out: 650 [Urine:650]  LAB RESULTS: Recent Labs    04/28/21 0245 04/30/21 0618  WBC 7.4 7.2  HGB 14.0 14.5  HCT 43.0 44.3  PLT 239 251   BMET Lab Results  Component Value Date   NA 134 (L) 04/30/2021   NA 133 (L) 04/28/2021   NA 138 04/27/2021   K 4.1 04/30/2021   K 3.4 (L) 04/28/2021   K 3.5 04/27/2021   CL 101 04/30/2021   CL 105 04/28/2021   CL 106 04/27/2021   CO2 23 04/30/2021   CO2 22 04/28/2021   CO2 24 04/27/2021   GLUCOSE 187 (H) 04/30/2021   GLUCOSE 283 (H) 04/28/2021   GLUCOSE 211 (H) 04/27/2021   BUN 9  04/30/2021   BUN 11 04/28/2021   BUN 11 04/27/2021   CREATININE 0.95 04/30/2021   CREATININE 0.98 04/28/2021   CREATININE 0.89 04/27/2021   CALCIUM 9.0 04/30/2021   CALCIUM 8.5 (L) 04/28/2021   CALCIUM 8.9 04/27/2021   LFT No results for input(s): PROT, ALBUMIN, AST, ALT, ALKPHOS, BILITOT, BILIDIR, IBILI in the last 72 hours. PT/INR Lab Results  Component Value Date   INR 1.0 04/28/2021   INR 1.0 04/26/2021   Hepatitis Panel No results for input(s): HEPBSAG, HCVAB, HEPAIGM, HEPBIGM in the last 72 hours. C-Diff No components found for: CDIFF Lipase  No results found for: LIPASE  Drugs of Abuse  No results found for: LABOPIA, COCAINSCRNUR, LABBENZ, AMPHETMU, THCU, LABBARB   RADIOLOGY STUDIES: CT CHEST ABDOMEN PELVIS W CONTRAST  Result Date: 04/29/2021 CLINICAL DATA:  Cancer of unknown primary, surveillance EXAM: CT CHEST, ABDOMEN, AND PELVIS WITH CONTRAST TECHNIQUE: Multidetector CT imaging of the chest, abdomen and pelvis was performed following the standard protocol during bolus administration of intravenous contrast. CONTRAST:  145m OMNIPAQUE IOHEXOL 350 MG/ML SOLN COMPARISON:  None. FINDINGS: CT CHEST FINDINGS Cardiovascular: Markedly dilated left ventricle. There is a dual lead pacemaker/AICD with leads in the right atrium and right ventricle.No pericardial disease.Normal size main and branch pulmonary arteries. There is no evidence of central pulmonary embolism.Common origin of the brachiocephalic and left common carotid arteries. Mediastinum/Nodes: No lymphadenopathy.The thyroid is unremarkable.Esophagus is unremarkable.The trachea is unremarkable. Lungs/Pleura: There is interlobular septal thickening and bilateral ground-glass opacities.No suspicious pulmonary nodules or masses.No pleural effusion.No pneumothorax. Musculoskeletal: No acute osseous abnormality.There is a 3 mm peripheral right apical subpleural solid pulmonary nodule (series 4, image 38). Focal skin thickening of  the left axilla (series 3, image 7). CT ABDOMEN PELVIS FINDINGS Hepatobiliary: No focal liver abnormality is seen. No gallstones, gallbladder wall thickening, or biliary dilatation. Pancreas: Unremarkable. No pancreatic ductal dilatation or surrounding inflammatory  changes. Spleen: Normal in size without focal abnormality. Adrenals/Urinary Tract: Adrenal glands are unremarkable. Bilateral focal renal cortical thinning/scarring. No hydronephrosis. No nephrolithiasis. No renal mass. The bladder is unremarkable. Stomach/Bowel: The stomach is within normal limits. There is no evidence of bowel obstruction. The appendix is normal. There is focal luminal narrowing and wall thickening within the ascending colon near the hepatic flexure (coronal images 80-83), with relatively preserved haustral folds. Vascular/Lymphatic: No significant vascular findings are present. No enlarged abdominal or pelvic lymph nodes. Reproductive: Unremarkable. Other: No bowel containing hernia.  No abdominopelvic ascites. Musculoskeletal: No acute osseous abnormality. No suspicious lytic or blastic lesions. There is a bone island in the left superior pubic ramus. IMPRESSION: Focal luminal narrowing and wall thickening within the ascending colon near the hepatic flexor. While this could represent focal peristalsis, recommend correlation with colonoscopy, if not recently performed, to rule out underlying colonic neoplasm. Focal skin thickening near the left axilla, recommend correlation with direct visualization. Markedly dilated left ventricle.  Mild pulmonary edema. Solid 3 mm right apical subpleural pulmonary nodule, of uncertain clinical significance. Electronically Signed   By: Maurine Simmering M.D.   On: 04/29/2021 15:11   DG FL GUIDED LUMBAR PUNCTURE  Result Date: 04/29/2021 CLINICAL DATA:  Provided history: Seizure. EXAM: DIAGNOSTIC LUMBAR PUNCTURE UNDER FLUOROSCOPIC GUIDANCE COMPARISON:  Brain MRI 04/27/2021. FLUOROSCOPY TIME:  Fluoroscopy  Time:  30 seconds Radiation Exposure Index (if provided by the fluoroscopic device): 4.8 mGy Number of Acquired Spot Images: None PROCEDURE: Informed consent was obtained from the patient prior to the procedure by Soyla Dryer, NP, following a discussion of procedural risks, benefits and alternatives. With the patient prone, the lower back was prepped with Betadine. 1% Lidocaine was used for local anesthesia. Lumbar puncture was performed at the L4-L5 level using a 20 gauge needle with return of clear CSF with an opening pressure of 20 cm water. 13 ml of CSF were obtained for laboratory studies. The patient tolerated the procedure well without immediate post-procedure complication. The procedure was performed by Soyla Dryer, NP. IMPRESSION: Technically successful fluoroscopically-guided lumbar puncture at the L4-L5 level. Opening pressure of 20 cm water. 13 mL of CSF obtained for laboratory studies. The patient tolerated the procedure well without immediate post-procedure complication. Electronically Signed   By: Kellie Simmering D.O.   On: 04/29/2021 11:22     IMPRESSION:   Narrowing and wall thickening in the region of hepatic flexure of ascending colon.  No acute or chronic intestinal symptoms.  No previous colonoscopy.  Not anemic.  Normal LFTs.  Paternal uncle diagnosed with advanced colon cancer in his late 72s versus early 9s.     New onset seizures with multiple brain lesions on MRI.  ? infection/granulomatous disease vs malignant? Seizure activity as recently as yesterday.     Poorly controlled IDDM.     Ischemic cardiomyopathy, CHF.  Status post ICD.  CKD.    PLAN:     Discussed plans for colonoscopy with the patient.  He is agreeable to proceed.  Dr. Candis Schatz will see the patient and decide on timing of this but could be done as soon as tomorrow.  However with seizure activity within what sounds like 24 hours, ?delay colonoscopy.   Azucena Freed  04/30/2021, 11:38 AM Phone  [phone]

## 2021-05-01 DIAGNOSIS — C7931 Secondary malignant neoplasm of brain: Secondary | ICD-10-CM

## 2021-05-01 DIAGNOSIS — R569 Unspecified convulsions: Secondary | ICD-10-CM | POA: Diagnosis not present

## 2021-05-01 DIAGNOSIS — R933 Abnormal findings on diagnostic imaging of other parts of digestive tract: Secondary | ICD-10-CM | POA: Diagnosis not present

## 2021-05-01 LAB — CBC
HCT: 43.5 % (ref 39.0–52.0)
Hemoglobin: 14 g/dL (ref 13.0–17.0)
MCH: 29.4 pg (ref 26.0–34.0)
MCHC: 32.2 g/dL (ref 30.0–36.0)
MCV: 91.2 fL (ref 80.0–100.0)
Platelets: 252 10*3/uL (ref 150–400)
RBC: 4.77 MIL/uL (ref 4.22–5.81)
RDW: 13.2 % (ref 11.5–15.5)
WBC: 7.1 10*3/uL (ref 4.0–10.5)
nRBC: 0 % (ref 0.0–0.2)

## 2021-05-01 LAB — COMPREHENSIVE METABOLIC PANEL
ALT: 15 U/L (ref 0–44)
AST: 14 U/L — ABNORMAL LOW (ref 15–41)
Albumin: 2.9 g/dL — ABNORMAL LOW (ref 3.5–5.0)
Alkaline Phosphatase: 67 U/L (ref 38–126)
Anion gap: 7 (ref 5–15)
BUN: 15 mg/dL (ref 6–20)
CO2: 25 mmol/L (ref 22–32)
Calcium: 8.9 mg/dL (ref 8.9–10.3)
Chloride: 104 mmol/L (ref 98–111)
Creatinine, Ser: 1.02 mg/dL (ref 0.61–1.24)
GFR, Estimated: >60 mL/min (ref >60)
Glucose, Bld: 240 mg/dL — ABNORMAL HIGH (ref 70–99)
Potassium: 3.9 mmol/L (ref 3.5–5.1)
Sodium: 136 mmol/L (ref 135–145)
Total Bilirubin: 0.4 mg/dL (ref 0.3–1.2)
Total Protein: 6 g/dL — ABNORMAL LOW (ref 6.5–8.1)

## 2021-05-01 LAB — GLUCOSE, CAPILLARY
Glucose-Capillary: 257 mg/dL — ABNORMAL HIGH (ref 70–99)
Glucose-Capillary: 145 mg/dL — ABNORMAL HIGH (ref 70–99)
Glucose-Capillary: 84 mg/dL (ref 70–99)
Glucose-Capillary: 91 mg/dL (ref 70–99)
Glucose-Capillary: 230 mg/dL — ABNORMAL HIGH (ref 70–99)
Glucose-Capillary: 170 mg/dL — ABNORMAL HIGH (ref 70–99)

## 2021-05-01 LAB — HSV 1/2 PCR, CSF
HSV-1 DNA: NEGATIVE
HSV-2 DNA: NEGATIVE

## 2021-05-01 MED ORDER — INSULIN ASPART 100 UNIT/ML IJ SOLN
4.0000 [IU] | Freq: Three times a day (TID) | INTRAMUSCULAR | Status: DC
  Administered 2021-05-01 – 2021-05-02 (×3): 4 [IU] via SUBCUTANEOUS

## 2021-05-01 MED ORDER — BACITRACIN-NEOMYCIN-POLYMYXIN OINTMENT TUBE
TOPICAL_OINTMENT | CUTANEOUS | Status: DC | PRN
  Filled 2021-05-01: qty 14

## 2021-05-01 NOTE — H&P (View-Only) (Signed)
[facility] ROUNDING NOTE   Subjective: No acute events overnight.  Patient feeling diaphoretic currently with relative hypoglycemia for him (FSBG 84)  No seizures noted on EEG overnight.  Pt wishing to proceed with colonoscopy tomorrow, no questions or concerns.   Objective: Vital signs in last 24 hours: Temp:  [97.8 F (36.6 C)-98.6 F (37 C)] 97.9 F (36.6 C) (10/30 1242) Pulse Rate:  [65-72] 70 (10/30 1242) Resp:  [14-20] 20 (10/30 1242) BP: (101-113)/(68-78) 101/77 (10/30 1242) SpO2:  [96 %-100 %] 99 % (10/30 1242) Last BM Date: 04/29/21 General: NAD, sweaty/diaphoretic due to low blood sugar, but otherwise well     Intake/Output from previous day: 10/29 0701 - 10/30 0700 In: 1786.5 [P.O.:960; I.V.:586.5] Out: 1650 [Urine:1650] Intake/Output this shift: Total I/O In: 81.3 [I.V.:81.3] Out: -    Lab Results: Recent Labs    04/30/21 0618 05/01/21 0319  WBC 7.2 7.1  HGB 14.5 14.0  PLT 251 252  MCV 90.8 91.2   BMET Recent Labs    04/30/21 0618 05/01/21 0319  NA 134* 136  K 4.1 3.9  CL 101 104  CO2 23 25  GLUCOSE 187* 240*  BUN 9 15  CREATININE 0.95 1.02  CALCIUM 9.0 8.9   LFT Recent Labs    05/01/21 0319  PROT 6.0*  ALBUMIN 2.9*  AST 14*  ALT 15  ALKPHOS 67  BILITOT 0.4   PT/INR No results for input(s): INR in the last 72 hours.    Imaging/Other results: No results found.    Assessment and Plan:  45 year old male with a history of CAD and cardiomyopathy (ischemic?) status post AICD placement, uncontrolled diabetes (A1c greater than 15.5) who was admitted following a generalized tonic clonic seizure (no history of seizure disorder).  As part of the work-up for his seizures, an MRI was performed which showed double enhancing parenchymal lesions in the brain, felt to be related to his seizures, but of uncertain etiology.  As metastatic cancer was in the differential, a CT chest, abdomen, pelvis was performed which showed  possible focal thickening in the hepatic flexure of the colon, possibly suggestive of a mass.  GI was consulted for possible colonoscopy.  Although the patient is fairly high risk for a sedated procedure given his new onset seizures/brain lesions, very poorly controlled diabetes and cardiomyopathy, I think a colonoscopy is reasonable in this case to try and establish an underlying etiology for his brain lesions.  I discussed the case with the on-call anesthesia provider, who felt that the risks of anesthesia were acceptable and to plan for colonoscopy.  Possible colon mass on CT - Colonoscopy Monday, Oct 31 - Clear liquid diet  - Start bowel this evening -N.p.o. after midnight tonight except for bowel prep -May need adjustment of insulin coverage given clear liquid diet, defer to primary team  Dr. Lorenso Courier will be taking over the GI service tomorrow and will performing his colonoscopy  Daryel November, MD  05/01/2021, 3:03 PM [facility]

## 2021-05-01 NOTE — Progress Notes (Signed)
LTM leads were off at time of disconnect were c3, f3, f4, f8, fz, fp1, fz, a1, p3, and p4. Redness behind left ear and forehead, but no breakdown seen. Dr. Lorrin Goodell ordering ointment for breakdown now.

## 2021-05-01 NOTE — Progress Notes (Signed)
LTM dcded. Bno skin breakdown seen. Results pending

## 2021-05-01 NOTE — Procedures (Addendum)
Patient Name: Mykeal Carrick  MRN: [id number]  Epilepsy Attending: Lora Havens  Referring Physician/Provider: Dr Donnetta Simpers Duration: 04/30/2021 1801 to 05/01/2021 0725   Patient history: 45 year old male with 2 minutes of seizure activity described as right-sided twitching with right gaze deviation followed by right-sided weakness.  EEG to evaluate for seizures.   Level of alertness: Awake, asleep   AEDs during EEG study: LEV   Technical aspects: This EEG study was done with scalp electrodes positioned according to the 10-20 International system of electrode placement. Electrical activity was acquired at a sampling rate of 500Hz  and reviewed with a high frequency filter of 70Hz  and a low frequency filter of 1Hz . EEG data were recorded continuously and digitally stored.    Description: The posterior dominant rhythm consists of 9 Hz activity of moderate voltage (25-35 uV) seen predominantly in posterior head regions, symmetric and reactive to eye opening and eye closing.  Sleep was characterized by meticulous, sleep spindles (12 to 14 Hz), maximal frontocentral region. Intermittent 3-5hz  theta-delta slowing was noted in left frontal-anterior temporal region  Study was technically difficult on 05/01/2021 after around 0630.    ABNORMALITY -Intermittent slow,  left frontal-anterior temporal region   IMPRESSION: This study is suggestive of cortical dysfunction in left frontal-anterior temporal region, likely secondary to underlying lesions. No seizures were seen during this study.   Vielka Klinedinst Barbra Sarks

## 2021-05-01 NOTE — Progress Notes (Signed)
[facility] ROUNDING NOTE   Subjective: No acute events overnight.  Patient feeling diaphoretic currently with relative hypoglycemia for him (FSBG 84)  No seizures noted on EEG overnight.  Pt wishing to proceed with colonoscopy tomorrow, no questions or concerns.   Objective: Vital signs in last 24 hours: Temp:  [97.8 F (36.6 C)-98.6 F (37 C)] 97.9 F (36.6 C) (10/30 1242) Pulse Rate:  [65-72] 70 (10/30 1242) Resp:  [14-20] 20 (10/30 1242) BP: (101-113)/(68-78) 101/77 (10/30 1242) SpO2:  [96 %-100 %] 99 % (10/30 1242) Last BM Date: 04/29/21 General: NAD, sweaty/diaphoretic due to low blood sugar, but otherwise well     Intake/Output from previous day: 10/29 0701 - 10/30 0700 In: 1786.5 [P.O.:960; I.V.:586.5] Out: 1650 [Urine:1650] Intake/Output this shift: Total I/O In: 81.3 [I.V.:81.3] Out: -    Lab Results: Recent Labs    04/30/21 0618 05/01/21 0319  WBC 7.2 7.1  HGB 14.5 14.0  PLT 251 252  MCV 90.8 91.2   BMET Recent Labs    04/30/21 0618 05/01/21 0319  NA 134* 136  K 4.1 3.9  CL 101 104  CO2 23 25  GLUCOSE 187* 240*  BUN 9 15  CREATININE 0.95 1.02  CALCIUM 9.0 8.9   LFT Recent Labs    05/01/21 0319  PROT 6.0*  ALBUMIN 2.9*  AST 14*  ALT 15  ALKPHOS 67  BILITOT 0.4   PT/INR No results for input(s): INR in the last 72 hours.    Imaging/Other results: No results found.    Assessment and Plan:  45 year old male with a history of CAD and cardiomyopathy (ischemic?) status post AICD placement, uncontrolled diabetes (A1c greater than 15.5) who was admitted following a generalized tonic clonic seizure (no history of seizure disorder).  As part of the work-up for his seizures, an MRI was performed which showed double enhancing parenchymal lesions in the brain, felt to be related to his seizures, but of uncertain etiology.  As metastatic cancer was in the differential, a CT chest, abdomen, pelvis was performed which showed  possible focal thickening in the hepatic flexure of the colon, possibly suggestive of a mass.  GI was consulted for possible colonoscopy.  Although the patient is fairly high risk for a sedated procedure given his new onset seizures/brain lesions, very poorly controlled diabetes and cardiomyopathy, I think a colonoscopy is reasonable in this case to try and establish an underlying etiology for his brain lesions.  I discussed the case with the on-call anesthesia provider, who felt that the risks of anesthesia were acceptable and to plan for colonoscopy.  Possible colon mass on CT - Colonoscopy Monday, Oct 31 - Clear liquid diet  - Start bowel this evening -N.p.o. after midnight tonight except for bowel prep -May need adjustment of insulin coverage given clear liquid diet, defer to primary team  Dr. Lorenso Courier will be taking over the GI service tomorrow and will performing his colonoscopy  Daryel November, MD  05/01/2021, 3:03 PM [facility]

## 2021-05-01 NOTE — Progress Notes (Addendum)
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 -In the emergency room he was noted to have CBG greater than 600 without DKA -Seen by neurology, loaded with Keppra, EEG ordered   Assessment & Plan:  New onset seizures Abnormal MRI-multiple enhancing parenchymal lesions -EEG abnormal noted epileptiform activity arising from left frontal anterior temporal region -Neurology following, continue Keppra, LTM with 1 seizure overnight, Keppra dose increased -Clinically much improved, stable on current dose -MRI brain concerning for multiple enhancing parenchymal lesions, etiology is unclear, malignancy vs inflammation, atypical infection is also a possibility but CSF cell counts do not suggest this -HIV negative,  -Neurology following, LP noted increased CSF glucose and protein, viral studies pending -CT chest abdomen pelvis with contrast noted focal thickening in the hepatic flexure of colon, could be peristalsis related, requested GI eval, plan for colonoscopy tomorrow  Uncontrolled type 2 diabetes mellitus Hyperosmolar nonketotic hyperglycemia -CBG> 600 on admission, without DKA, patient does not remember missing any insulin doses -At baseline on Levemir 55 units twice daily and NovoLog 3 times daily with meals, metformin on hold -CBGs in the 200s, increase NovoLog, continue Semglee -HbA1c> 15.5  History of CAD History of chronic systolic and diastolic CHF with AICD -EF unknown -Clinically euvolemic at this time, continue carvedilol, amiodarone, statin -Lasix and  Aldactone on hold, resume low-dose Lasix in 1 to 2 days  R lung nodule -needs FU  Hypertension -BP soft, hold Cozaar  DVT prophylaxis: Lovenox after today's dose Code Status: Full code Family Communication: no family at bedside, left msg for Simon Rhein [phone] yesterday-per pts request Disposition Plan:  Status is: Inpatient  Remains inpatient appropriate because: Severity of illness   Consultants:  Neurology IR  Procedures: Lumbar puncture 10/28  Antimicrobials:    Subjective: -Feels okay, no events overnight  Objective: Vitals:   04/30/21 2020 04/30/21 2333 05/01/21 0345 05/01/21 0911  BP: 102/69 110/68 102/71 107/75  Pulse: 71 72 72 66  Resp: '16 15 14 16  ' Temp: 98.6 F (37 C) 98 F (36.7 C) 97.9 F (36.6 C) 97.8 F (36.6 C)  TempSrc: Oral Oral Oral Oral  SpO2: 97% 96% 97% 100%  Weight:      Height:        Intake/Output Summary (Last 24 hours) at 05/01/2021 1114 Last data filed at 05/01/2021 0539 Gross per 24 hour  Intake 1426.47 ml  Output 1000 ml  Net 426.47 ml   Filed Weights   04/26/21 1700 04/27/21 1513  Weight: 42.6 kg 98.1 kg    Examination:  General exam: Pleasant averagely built male sitting up in bed, AAOx3, no distress, no cognitive delay noted today CVS: S1-S2, regular rate rhythm Lungs: Clear bilaterally Abdomen: Soft, nontender, bowel sounds present Extremities: No edema  Skin: No rash on exposed skin Psychiatry: Mood & affect appropriate.     Data Reviewed:   CBC: Recent Labs  Lab 04/26/21 1002 04/26/21 1009 04/27/21 0343 04/28/21 0245 04/30/21 0618 05/01/21 0319  WBC 6.0  --  6.7 7.4 7.2 7.1  NEUTROABS 4.4  --  4.0  --   --   --  HGB 15.6 16.7 14.4 14.0 14.5 14.0  HCT 47.0 49.0 42.9 43.0 44.3 43.5  MCV 90.0  --  89.6 91.1 90.8 91.2  PLT 286  --  274 239 251 373   Basic Metabolic Panel: Recent Labs  Lab 04/26/21 1344 04/26/21 1445 04/27/21 0343 04/28/21 0245 04/30/21 0618 05/01/21 0319  NA  --  135  138 133* 134* 136  K  --  4.5 3.5 3.4* 4.1 3.9  CL  --  104 106 105 101 104  CO2  --  19* '24 22 23 25  ' GLUCOSE  --  273* 211* 283* 187* 240*  BUN  --  '10 11 11 9 15  ' CREATININE  --  0.91 0.89 0.98 0.95 1.02  CALCIUM  --  8.9 8.9 8.5* 9.0 8.9  MG 2.2  --   --   --   --   --   PHOS 3.0  --   --   --   --   --    GFR: Estimated Creatinine Clearance: 108.5 mL/min (by C-G formula based on SCr of 1.02 mg/dL). Liver Function Tests: Recent Labs  Lab 04/26/21 1002 05/01/21 0319  AST 19 14*  ALT 12 15  ALKPHOS 84 67  BILITOT 0.8 0.4  PROT 7.4 6.0*  ALBUMIN 3.6 2.9*   No results for input(s): LIPASE, AMYLASE in the last 168 hours. No results for input(s): AMMONIA in the last 168 hours. Coagulation Profile: Recent Labs  Lab 04/26/21 1002 04/28/21 1025  INR 1.0 1.0   Cardiac Enzymes: No results for input(s): CKTOTAL, CKMB, CKMBINDEX, TROPONINI in the last 168 hours. BNP (last 3 results) No results for input(s): PROBNP in the last 8760 hours. HbA1C: No results for input(s): HGBA1C in the last 72 hours.  CBG: Recent Labs  Lab 04/30/21 0626 04/30/21 1221 04/30/21 1751 04/30/21 2212 05/01/21 0605  GLUCAP 192* 214* 149* 261* 257*   Lipid Profile: No results for input(s): CHOL, HDL, LDLCALC, TRIG, CHOLHDL, LDLDIRECT in the last 72 hours.  Thyroid Function Tests: No results for input(s): TSH, T4TOTAL, FREET4, T3FREE, THYROIDAB in the last 72 hours. Anemia Panel: No results for input(s): VITAMINB12, FOLATE, FERRITIN, TIBC, IRON, RETICCTPCT in the last 72 hours. Urine analysis:    Component Value Date/Time   COLORURINE STRAW (A) 04/26/2021 1030   APPEARANCEUR CLEAR 04/26/2021 1030   LABSPEC 1.026 04/26/2021 1030   PHURINE 6.0 04/26/2021 1030   GLUCOSEU >=500 (A) 04/26/2021 1030   HGBUR NEGATIVE 04/26/2021 1030   [place] 04/26/2021 1030   [place] 04/26/2021 1030   PROTEINUR 100 (A) 04/26/2021 1030   NITRITE NEGATIVE 04/26/2021 1030    LEUKOCYTESUR NEGATIVE 04/26/2021 1030   Sepsis Labs: '@LABRCNTIP' (procalcitonin:4,lacticidven:4)  ) Recent Results (from the past 240 hour(s))  CSF culture w Gram Stain     Status: None (Preliminary result)   Collection Time: 04/29/21 10:29 AM   Specimen: CSF  Result Value Ref Range Status   Specimen Description CSF  Final   Special Requests NONE  Final   Gram Stain   Final    WBC PRESENT, PREDOMINANTLY MONONUCLEAR NO ORGANISMS SEEN CYTOSPIN SMEAR    Culture   Final    NO GROWTH 2 DAYS Performed at [facility], [place] [address], [place], [place] [address]    Report Status PENDING  Incomplete      Radiology Studies: CT CHEST ABDOMEN PELVIS W CONTRAST  Result Date: 04/29/2021 CLINICAL DATA:  Cancer of unknown primary, surveillance EXAM: CT CHEST,  ABDOMEN, AND PELVIS WITH CONTRAST TECHNIQUE: Multidetector CT imaging of the chest, abdomen and pelvis was performed following the standard protocol during bolus administration of intravenous contrast. CONTRAST:  163m OMNIPAQUE IOHEXOL 350 MG/ML SOLN COMPARISON:  None. FINDINGS: CT CHEST FINDINGS Cardiovascular: Markedly dilated left ventricle. There is a dual lead pacemaker/AICD with leads in the right atrium and right ventricle.No pericardial disease.Normal size main and branch pulmonary arteries. There is no evidence of central pulmonary embolism.Common origin of the brachiocephalic and left common carotid arteries. Mediastinum/Nodes: No lymphadenopathy.The thyroid is unremarkable.Esophagus is unremarkable.The trachea is unremarkable. Lungs/Pleura: There is interlobular septal thickening and bilateral ground-glass opacities.No suspicious pulmonary nodules or masses.No pleural effusion.No pneumothorax. Musculoskeletal: No acute osseous abnormality.There is a 3 mm peripheral right apical subpleural solid pulmonary nodule (series 4, image 38). Focal skin thickening of the left axilla (series 3, image 7). CT ABDOMEN PELVIS FINDINGS  Hepatobiliary: No focal liver abnormality is seen. No gallstones, gallbladder wall thickening, or biliary dilatation. Pancreas: Unremarkable. No pancreatic ductal dilatation or surrounding inflammatory changes. Spleen: Normal in size without focal abnormality. Adrenals/Urinary Tract: Adrenal glands are unremarkable. Bilateral focal renal cortical thinning/scarring. No hydronephrosis. No nephrolithiasis. No renal mass. The bladder is unremarkable. Stomach/Bowel: The stomach is within normal limits. There is no evidence of bowel obstruction. The appendix is normal. There is focal luminal narrowing and wall thickening within the ascending colon near the hepatic flexure (coronal images 80-83), with relatively preserved haustral folds. Vascular/Lymphatic: No significant vascular findings are present. No enlarged abdominal or pelvic lymph nodes. Reproductive: Unremarkable. Other: No bowel containing hernia.  No abdominopelvic ascites. Musculoskeletal: No acute osseous abnormality. No suspicious lytic or blastic lesions. There is a bone island in the left superior pubic ramus. IMPRESSION: Focal luminal narrowing and wall thickening within the ascending colon near the hepatic flexor. While this could represent focal peristalsis, recommend correlation with colonoscopy, if not recently performed, to rule out underlying colonic neoplasm. Focal skin thickening near the left axilla, recommend correlation with direct visualization. Markedly dilated left ventricle.  Mild pulmonary edema. Solid 3 mm right apical subpleural pulmonary nodule, of uncertain clinical significance. Electronically Signed   By: JMaurine SimmeringM.D.   On: 04/29/2021 15:11    Scheduled Meds:   stroke: mapping our early stages of recovery book   Does not apply Once   amiodarone  200 mg Oral Daily   aspirin EC  81 mg Oral Daily   atorvastatin  80 mg Oral Daily   carvedilol  25 mg Oral BID WC   chlorhexidine gluconate (MEDLINE KIT)  15 mL Mouth Rinse BID    enoxaparin (LOVENOX) injection  40 mg Subcutaneous Q24H   insulin aspart  0-15 Units Subcutaneous TID WC   insulin aspart  0-5 Units Subcutaneous QHS   insulin aspart  4 Units Subcutaneous TID WC   insulin glargine-yfgn  45 Units Subcutaneous Daily   levETIRAcetam  1,500 mg Oral BID   mouth rinse  15 mL Mouth Rinse 10 times per day   peg 3350 powder  0.5 kit Oral Once   And   [START ON 05/02/2021] peg 3350 powder  0.5 kit Oral Once   Continuous Infusions:  sodium chloride 10 mL/hr at 05/01/21 0539     LOS: 5 days    Time spent: 267m  PrDomenic PoliteMD [facility]   05/01/2021, 11:14 AM

## 2021-05-02 ENCOUNTER — Encounter (HOSPITAL_COMMUNITY): Payer: Self-pay | Admitting: Internal Medicine

## 2021-05-02 ENCOUNTER — Inpatient Hospital Stay (HOSPITAL_COMMUNITY): Payer: Medicare PPO | Admitting: Anesthesiology

## 2021-05-02 ENCOUNTER — Encounter (HOSPITAL_COMMUNITY)
Admission: EM | Disposition: A | Discharge status: Home / Self Care | Payer: Self-pay | Source: Home / Self Care | Attending: Internal Medicine

## 2021-05-02 DIAGNOSIS — G939 Disorder of brain, unspecified: Secondary | ICD-10-CM

## 2021-05-02 DIAGNOSIS — E0869 Diabetes mellitus due to underlying condition with other specified complication: Secondary | ICD-10-CM

## 2021-05-02 DIAGNOSIS — I5042 Chronic combined systolic (congestive) and diastolic (congestive) heart failure: Secondary | ICD-10-CM

## 2021-05-02 DIAGNOSIS — D123 Benign neoplasm of transverse colon: Secondary | ICD-10-CM

## 2021-05-02 DIAGNOSIS — R569 Unspecified convulsions: Secondary | ICD-10-CM | POA: Diagnosis not present

## 2021-05-02 DIAGNOSIS — Z86010 Personal history of colonic polyps: Secondary | ICD-10-CM

## 2021-05-02 DIAGNOSIS — Z794 Long term (current) use of insulin: Secondary | ICD-10-CM

## 2021-05-02 DIAGNOSIS — R634 Abnormal weight loss: Secondary | ICD-10-CM | POA: Diagnosis not present

## 2021-05-02 DIAGNOSIS — K529 Noninfective gastroenteritis and colitis, unspecified: Secondary | ICD-10-CM

## 2021-05-02 DIAGNOSIS — N183 Chronic kidney disease, stage 3 unspecified: Secondary | ICD-10-CM

## 2021-05-02 DIAGNOSIS — Z4502 Encounter for adjustment and management of automatic implantable cardiac defibrillator: Secondary | ICD-10-CM

## 2021-05-02 DIAGNOSIS — K633 Ulcer of intestine: Secondary | ICD-10-CM

## 2021-05-02 DIAGNOSIS — D122 Benign neoplasm of ascending colon: Secondary | ICD-10-CM

## 2021-05-02 HISTORY — PX: POLYPECTOMY: SHX5525

## 2021-05-02 HISTORY — PX: COLONOSCOPY: SHX5424

## 2021-05-02 HISTORY — PX: BIOPSY: SHX5522

## 2021-05-02 LAB — CSF CULTURE W GRAM STAIN: Culture: NO GROWTH

## 2021-05-02 LAB — GLUCOSE, CAPILLARY
Glucose-Capillary: 180 mg/dL — ABNORMAL HIGH (ref 70–99)
Glucose-Capillary: 165 mg/dL — ABNORMAL HIGH (ref 70–99)
Glucose-Capillary: 175 mg/dL — ABNORMAL HIGH (ref 70–99)
Glucose-Capillary: 109 mg/dL — ABNORMAL HIGH (ref 70–99)

## 2021-05-02 LAB — CYTOLOGY - NON PAP

## 2021-05-02 LAB — VDRL, CSF: VDRL Quant, CSF: NONREACTIVE

## 2021-05-02 SURGERY — COLONOSCOPY
Anesthesia: Monitor Anesthesia Care

## 2021-05-02 MED ORDER — PROPOFOL 500 MG/50ML IV EMUL
INTRAVENOUS | Status: DC | PRN
  Administered 2021-05-02: 150 ug/kg/min via INTRAVENOUS

## 2021-05-02 MED ORDER — PHENYLEPHRINE 40 MCG/ML (10ML) SYRINGE FOR IV PUSH (FOR BLOOD PRESSURE SUPPORT)
PREFILLED_SYRINGE | INTRAVENOUS | Status: DC | PRN
  Administered 2021-05-02: 120 ug via INTRAVENOUS
  Administered 2021-05-02 (×2): 80 ug via INTRAVENOUS
  Administered 2021-05-02: 120 ug via INTRAVENOUS

## 2021-05-02 MED ORDER — EPHEDRINE SULFATE-NACL 50-0.9 MG/10ML-% IV SOSY
PREFILLED_SYRINGE | INTRAVENOUS | Status: DC | PRN
  Administered 2021-05-02 (×2): 10 mg via INTRAVENOUS
  Administered 2021-05-02: 5 mg via INTRAVENOUS

## 2021-05-02 MED ORDER — LACTATED RINGERS IV SOLN
INTRAVENOUS | Status: AC | PRN
  Administered 2021-05-02: 1000 mL via INTRAVENOUS

## 2021-05-02 NOTE — Anesthesia Preprocedure Evaluation (Addendum)
Anesthesia Evaluation  Patient identified by MRN, date of birth, ID band Patient awake    Reviewed: Allergy & Precautions, NPO status , Patient's Chart, lab work & pertinent test results  Airway Mallampati: III  TM Distance: >3 FB Neck ROM: Full    Dental  (+) Edentulous Lower, Edentulous Upper   Pulmonary neg pulmonary ROS,    Pulmonary exam normal breath sounds clear to auscultation       Cardiovascular negative cardio ROS Normal cardiovascular exam Rhythm:Regular Rate:Normal     Neuro/Psych Seizures -,  negative psych ROS   GI/Hepatic Neg liver ROS, Bowel prep,  Endo/Other  diabetes, Insulin Dependent, Oral Hypoglycemic Agents  Renal/GU negative Renal ROS     Musculoskeletal negative musculoskeletal ROS (+)   Abdominal (+) + obese,   Peds  Hematology HLD   Anesthesia Other Findings Abnormal CT of the colon  Reproductive/Obstetrics                            Anesthesia Physical Anesthesia Plan  ASA: 2  Anesthesia Plan: MAC   Post-op Pain Management:    Induction: Intravenous  PONV Risk Score and Plan: 1 and Propofol infusion and Treatment may vary due to age or medical condition  Airway Management Planned: Simple Face Mask  Additional Equipment:   Intra-op Plan:   Post-operative Plan:   Informed Consent: I have reviewed the patients History and Physical, chart, labs and discussed the procedure including the risks, benefits and alternatives for the proposed anesthesia with the patient or authorized representative who has indicated his/her understanding and acceptance.       Plan Discussed with: CRNA  Anesthesia Plan Comments:        Anesthesia Quick Evaluation

## 2021-05-02 NOTE — Consult Note (Signed)
Date of Admission:  04/26/2021          Reason for Consult: Brain masses    Referring Provider: Domenic Polite, MD   Assessment:  Multiple enhancing parenchymal and leptomeningeal lesions in left frontoparietal insular area Seizures Areas of colonic ulceration with inflammation seen on endoscopy Diabetes mellitus VT Coronary artery disease CHF with ICD CKD  Plan:  Followup biopsy from colon He is going to need brain biopsy with tissue to pathology, along with dedicated specimen sent for AFB stain and culture fungal stain and culture and bacterial stain and culture Check Quantiferon gold, urine histoplasma antigen, ACE level  Active Problems:   Seizure (Home)   Noninfectious gastroenteritis   History of colonic polyps   Scheduled Meds:   stroke: mapping our early stages of recovery book   Does not apply Once   amiodarone  200 mg Oral Daily   aspirin EC  81 mg Oral Daily   atorvastatin  80 mg Oral Daily   carvedilol  25 mg Oral BID WC   chlorhexidine gluconate (MEDLINE KIT)  15 mL Mouth Rinse BID   enoxaparin (LOVENOX) injection  40 mg Subcutaneous Q24H   insulin aspart  0-15 Units Subcutaneous TID WC   insulin aspart  0-5 Units Subcutaneous QHS   insulin glargine-yfgn  45 Units Subcutaneous Daily   levETIRAcetam  1,500 mg Oral BID   mouth rinse  15 mL Mouth Rinse 10 times per day   Continuous Infusions:  sodium chloride 10 mL/hr at 05/01/21 1347   PRN Meds:.influenza vac split quadrivalent PF, LORazepam, neomycin-bacitracin-polymyxin, ondansetron **OR** ondansetron (ZOFRAN) IV  HPI: Jeremy Burns is a 45 y.o. 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  He has started noticing significant headaches in the days preceding admission.  He then apparently after  falling asleep woke up and felt "foggy with slow speech and inability to think clearly.  He was collapsed in the store.  EMS arrived and found him to be unresponsive.  He had tonic-clonic seizures witnessed on route to the hospital.  CT of the head and then MRI of the brain with the latter showing multiple enhancing parenchymal and leptomeningeal lesions in the left frontoparietal insular region.   He underwent lumbar puncture which showed 4 white blood cells in tube 1 with 1 white cell in tube 4, glucose of 148 and protein of 104 (no glucose available from that same day and blood), with negative cryptococcal antigen and cultures unrevealing from CSF.  His HIV antibody is negative.  He underwent CT scan of the abdomen pelvis which showed focal narrowing and thickening within the ascending colon to the hepatic flexure.  He has now undergone endoscopy with GI and areas of ulceration with inflammation in that exact area have been found biopsies are pending.  Talking to him it sounds as if he is lost 15 pounds recently though this sounds like this happened in the context of aggressive diuresis.  He has had night sweats in the past but not consistently recently.  Tells me he had another event which sounded like a syncopal event with a fall that happened when he had been coughing more than a year ago.  He tells me he believes his ex-wife who died from a gunshot wound may have had tuberculosis.  Denies having been tested for TB himself.  He spent a few nights in jail but was never in prison.  He grew up in the [place] area and has only traveled along the [facility] [place].  Differential for his blank brain lesion certainly does include infectious causes such as tuberculosis, dimorphic fungi, less likely pyogenic bacterial infection, sarcoidosis and certainly malignancy.  I am more concerned about the latter given the findings on CT imaging and colonoscopy.  I think unless  the biopsy from: [name] a definitive diagnosis he is going to need a brain biopsy to allow [place] to understand if he has an infectious or noninfectious pathology present.  I spent 84 minutes with the patient including than 50% of the time in face to face counseling of the patient guarding the work-up for his brain lesions, findings on CT abdomen pelvis differential for these lesions, personally reviewing CT head CT abdomen pelvis MRI of the brain CSF studies CMP CBC CSF cultures along with review of medical records in preparation for the visit and during the visit and in coordination of his care.   Review of Systems: Review of Systems  Constitutional:  Positive for weight loss. Negative for chills, fever and malaise/fatigue.  HENT:  Negative for congestion and sore throat.   Eyes:  Negative for blurred vision and photophobia.  Respiratory:  Negative for cough, shortness of breath and wheezing.   Cardiovascular:  Negative for chest pain, palpitations and leg swelling.  Gastrointestinal:  Negative for abdominal pain, blood in stool, constipation, diarrhea, heartburn, melena, nausea and vomiting.  Genitourinary:  Negative for dysuria, flank pain and hematuria.  Musculoskeletal:  Negative for back pain, falls, joint pain and myalgias.  Skin:  Negative for itching and rash.  Neurological:  Positive for seizures and headaches. Negative for dizziness, focal weakness, loss of consciousness and weakness.  Endo/Heme/Allergies:  Does not bruise/bleed easily.  Psychiatric/Behavioral:  Negative for depression and suicidal ideas. The patient does not have insomnia.    Past Medical History:  Diagnosis Date   AICD (automatic cardioverter/defibrillator) present        No family history on file. No Known Allergies  OBJECTIVE: Blood pressure 110/66, pulse 96, temperature 97.7 F (36.5 C), temperature source Oral, resp. rate 14, height '5\' 10"'  (1.778 m), weight 98.1 kg, SpO2 99 %.  Physical  Exam Constitutional:      Appearance: He is well-developed.  HENT:     Head: Normocephalic and atraumatic.  Eyes:     Conjunctiva/sclera: Conjunctivae normal.  Cardiovascular:     Rate and Rhythm: Normal rate and regular rhythm.     Heart sounds: No murmur heard.   No friction rub. No gallop.  Pulmonary:     Effort: Pulmonary effort is normal. No respiratory distress.     Breath sounds: Normal breath sounds. No stridor. No wheezing or rhonchi.  Abdominal:     General: Abdomen is flat. There is no distension.     Palpations: Abdomen is soft. There is no mass.  Musculoskeletal:        General: No swelling or tenderness. Normal range of motion.     Cervical back: Normal range of motion and neck supple.     Right lower leg: No edema.     Left lower leg: No edema.  Skin:    General: Skin is warm and dry.     Coloration: Skin is not pale.     Findings: No erythema or rash.  Neurological:     General: No focal deficit present.  Mental Status: He is alert and oriented to person, place, and time.  Psychiatric:        Mood and Affect: Mood normal.        Behavior: Behavior normal.        Thought Content: Thought content normal.        Judgment: Judgment normal.    Lab Results Lab Results  Component Value Date   WBC 7.1 05/01/2021   HGB 14.0 05/01/2021   HCT 43.5 05/01/2021   MCV 91.2 05/01/2021   PLT 252 05/01/2021    Lab Results  Component Value Date   CREATININE 1.02 05/01/2021   BUN 15 05/01/2021   NA 136 05/01/2021   K 3.9 05/01/2021   CL 104 05/01/2021   CO2 25 05/01/2021    Lab Results  Component Value Date   ALT 15 05/01/2021   AST 14 (L) 05/01/2021   ALKPHOS 67 05/01/2021   BILITOT 0.4 05/01/2021     Microbiology: Recent Results (from the past 240 hour(s))  CSF culture w Gram Stain     Status: None   Collection Time: 04/29/21 10:29 AM   Specimen: CSF  Result Value Ref Range Status   Specimen Description CSF  Final   Special Requests NONE  Final    Gram Stain   Final    WBC PRESENT, PREDOMINANTLY MONONUCLEAR NO ORGANISMS SEEN CYTOSPIN SMEAR    Culture   Final    NO GROWTH 3 DAYS Performed at [facility], [address] [place], [place] [address]    Report Status 05/02/2021 FINAL  Final    Alcide Evener, [facility] [phone] pager  05/02/2021, 3:24 PM

## 2021-05-02 NOTE — Progress Notes (Addendum)
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 -In the emergency room he was noted to have CBG greater than 600 without DKA -Seen by neurology, loaded with Keppra, EEG ordered -Had multiple seizures earlier this admission, Keppra dose was increased, upon further work-up he was noted to have multiple enhancing parenchymal brain lesions etiology of which remains unclear, work-up ongoing  Assessment & Plan:  New onset seizures Abnormal MRI-multiple enhancing parenchymal lesions -EEG abnormal noted epileptiform activity arising from left frontal anterior temporal region -Neurology following, continue Keppra, LTM with 1 seizure overnight, Keppra dose increased -Clinically much improved, stable on current dose -MRI brain concerning for multiple enhancing parenchymal lesions, etiology is unclear, malignancy vs inflammation, atypical infection is also a possibility but CSF cell counts do not suggest this -HIV negative, JC virus and VDRL pending -Neurology following, LP noted increased CSF glucose and protein, viral studies pending -CT chest abdomen pelvis with contrast noted focal thickening in the hepatic flexure of colon, could be peristalsis related, requested GI eval, underwent colonoscopy which was negative for malignancy, nonspecific ulcers noted in the hepatic flexure, 2 polyps removed -Will request infectious disease input -May need brain biopsy as well, neurology to discuss with neurosurgery  Uncontrolled type 2 diabetes mellitus Hyperosmolar nonketotic hyperglycemia -CBG> 600 on admission, without DKA, patient does  not remember missing any insulin doses -At baseline on Levemir 55 units twice daily and NovoLog 3 times daily with meals, metformin on hold -CBGs less than 100 today, received a.m. dose of Semglee, will hold meal coverage today -HbA1c> 15.5  History of CAD History of chronic systolic and diastolic CHF with AICD -EF unknown -Clinically euvolemic at this time, continue carvedilol, amiodarone, statin -Lasix and Aldactone on hold, resume low-dose Lasix in 1 to 2 days  Colonic Ulcers -Colonoscopy noted multiple ulcers with surrounding inflammation hepatic flexure and 2 transverse colon polyps which were resected -Etiology is unclear but clinically asymptomatic -Follow-up pathology  R lung nodule -needs FU  Hypertension -BP soft, hold Cozaar  DVT prophylaxis: Hold Lovenox Code Status: Full code Family Communication: no family at bedside, called and updated aunt Dianah Field [phone] --per pts request Disposition Plan:  Status is: Inpatient  Remains inpatient appropriate because: Severity of illness   Consultants:  Neurology IR  Procedures: Lumbar puncture 10/28  Antimicrobials:    Subjective: -Back from colonoscopy  Objective: Vitals:   05/02/21 1009 05/02/21 1024 05/02/21 1039 05/02/21 1210  BP: 128/84 133/90 118/83 110/66  Pulse: 74 82 63 96  Resp: _0 Temp:   98.1 F (36.7 C) 97.7 F (36.5 C)  TempSrc:    Oral  SpO2: 96% 99% 97% 99%  Weight:      Height:        Intake/Output Summary (Last 24 hours) at 05/02/2021 1348 Last data filed at 05/02/2021 1303 Gross per 24 hour  Intake 518 ml  Output 2025 ml  Net -1507 ml   Filed Weights   04/26/21 1700 04/27/21 1513 05/02/21 0844  Weight: 42.6 kg 98.1 kg 98.1 kg    Examination:  General exam: Pleasant averagely built male sitting  up in bed, AAOx3, no distress, no cognitive delay noted today CVS: S1-S2, regular rate rhythm Lungs: Clear bilaterally Abdomen: Soft, nontender, bowel sounds  present Extremities: No edema  Skin: No rash on exposed skin Psychiatry: Mood & affect appropriate.     Data Reviewed:   CBC: Recent Labs  Lab 04/26/21 1002 04/26/21 1009 04/27/21 0343 04/28/21 0245 04/30/21 0618 05/01/21 0319  WBC 6.0  --  6.7 7.4 7.2 7.1  NEUTROABS 4.4  --  4.0  --   --   --   HGB 15.6 16.7 14.4 14.0 14.5 14.0  HCT 47.0 49.0 42.9 43.0 44.3 43.5  MCV 90.0  --  89.6 91.1 90.8 91.2  PLT 286  --  274 239 251 887   Basic Metabolic Panel: Recent Labs  Lab 04/26/21 1344 04/26/21 1445 04/27/21 0343 04/28/21 0245 04/30/21 0618 05/01/21 0319  NA  --  135 138 133* 134* 136  K  --  4.5 3.5 3.4* 4.1 3.9  CL  --  104 106 105 101 104  CO2  --  19* _0 GLUCOSE  --  273* 211* 283* 187* 240*  BUN  --  _1 CREATININE  --  0.91 0.89 0.98 0.95 1.02  CALCIUM  --  8.9 8.9 8.5* 9.0 8.9  MG 2.2  --   --   --   --   --   PHOS 3.0  --   --   --   --   --    GFR: Estimated Creatinine Clearance: 108.5 mL/min (by C-G formula based on SCr of 1.02 mg/dL). Liver Function Tests: Recent Labs  Lab 04/26/21 1002 05/01/21 0319  AST 19 14*  ALT 12 15  ALKPHOS 84 67  BILITOT 0.8 0.4  PROT 7.4 6.0*  ALBUMIN 3.6 2.9*   No results for input(s): LIPASE, AMYLASE in the last 168 hours. No results for input(s): AMMONIA in the last 168 hours. Coagulation Profile: Recent Labs  Lab 04/26/21 1002 04/28/21 1025  INR 1.0 1.0   Cardiac Enzymes: No results for input(s): CKTOTAL, CKMB, CKMBINDEX, TROPONINI in the last 168 hours. BNP (last 3 results) No results for input(s): PROBNP in the last 8760 hours. HbA1C: No results for input(s): HGBA1C in the last 72 hours.  CBG: Recent Labs  Lab 05/01/21 1658 05/01/21 2053 05/02/21 0623 05/02/21 1003 05/02/21 1207  GLUCAP 230* 170* 180* 165* 175*   Lipid Profile: No results for input(s): CHOL, HDL, LDLCALC, TRIG, CHOLHDL, LDLDIRECT in the last 72 hours.  Thyroid Function Tests: No results for input(s):  TSH, T4TOTAL, FREET4, T3FREE, THYROIDAB in the last 72 hours. Anemia Panel: No results for input(s): VITAMINB12, FOLATE, FERRITIN, TIBC, IRON, RETICCTPCT in the last 72 hours. Urine analysis:    Component Value Date/Time   COLORURINE STRAW (A) 04/26/2021 1030   APPEARANCEUR CLEAR 04/26/2021 1030   LABSPEC 1.026 04/26/2021 1030   PHURINE 6.0 04/26/2021 1030   GLUCOSEU >=500 (A) 04/26/2021 1030   HGBUR NEGATIVE 04/26/2021 1030   [place] 04/26/2021 1030   [place] 04/26/2021 1030   PROTEINUR 100 (A) 04/26/2021 1030   NITRITE NEGATIVE 04/26/2021 1030   LEUKOCYTESUR NEGATIVE 04/26/2021 1030   Sepsis Labs: _2 (procalcitonin:4,lacticidven:4)  ) Recent Results (from the past 240 hour(s))  CSF culture w Gram Stain     Status: None (Preliminary result)   Collection Time: 04/29/21 10:29 AM   Specimen: CSF  Result Value Ref Range Status   Specimen Description  CSF  Final   Special Requests NONE  Final   Gram Stain   Final    WBC PRESENT, PREDOMINANTLY MONONUCLEAR NO ORGANISMS SEEN CYTOSPIN SMEAR    Culture   Final    NO GROWTH 3 DAYS Performed at [facility], [address] [place], [place] [address]    Report Status PENDING  Incomplete      Radiology Studies: No results found.  Scheduled Meds:   stroke: mapping our early stages of recovery book   Does not apply Once   amiodarone  200 mg Oral Daily   aspirin EC  81 mg Oral Daily   atorvastatin  80 mg Oral Daily   carvedilol  25 mg Oral BID WC   chlorhexidine gluconate (MEDLINE KIT)  15 mL Mouth Rinse BID   enoxaparin (LOVENOX) injection  40 mg Subcutaneous Q24H   insulin aspart  0-15 Units Subcutaneous TID WC   insulin aspart  0-5 Units Subcutaneous QHS   insulin aspart  4 Units Subcutaneous TID WC   insulin glargine-yfgn  45 Units Subcutaneous Daily   levETIRAcetam  1,500 mg Oral BID   mouth rinse  15 mL Mouth Rinse 10 times per day   Continuous Infusions:  sodium chloride 10  mL/hr at 05/01/21 1347     LOS: 6 days    Time spent: 49mn  PDomenic Polite MD [facility]   05/02/2021, 1:48 PM

## 2021-05-02 NOTE — Consult Note (Signed)
Neurosurgery Consultation  Reason for Consult: Seizures, brain mass Referring Physician: Hortense Ramal  CC: Seizures  HPI: This is a 45 y.o. man that presented with new onset seizures, was post-ictal at arrival. He actually seems to recall some of the event, but I suspect some of it was shared with him. He describes an aura followed by right sided weakness then LOC. He also noticed some occasional clumsiness with the right hand and some episodes of staring. He has been admitted and had an extensive workup to further evaluate the cause of his seizures. Seizures have been well controlled during this workup. MRI brain showed some new patchy cortical and leptomeningeal enhancement, LP was performed which was only remarkable for some increased protein (has inc'd peripheral glucose to match his CSF glucose). CT CAP was notable for some colonic abnormality, which was biopsied today via colonoscopy but reportedly was fairly benign appearing and not concerning for malignancy. PMHx is notable for his age and includes DM with a high A1C, CHF w/ AICD, CKD stage 1, DLD/HTN. This morning he was feeling well when I saw him, was standing at the sink and brushing his teeth without difficulty.    ROS: A 14 point ROS was performed and is negative except as noted in the HPI.   PMHx:  Past Medical History:  Diagnosis Date   AICD (automatic cardioverter/defibrillator) present    FamHx: No family history on file. SocHx:  has no history on file for tobacco use, alcohol use, and drug use.  Exam: Vital signs in last 24 hours: Temp:  [97.6 F (36.4 C)-98.4 F (36.9 C)] 98.4 F (36.9 C) (10/31 1525) Pulse Rate:  [63-96] 71 (10/31 1525) Resp:  [12-20] 14 (10/31 1525) BP: (86-133)/(60-90) 113/87 (10/31 1525) SpO2:  [93 %-100 %] 100 % (10/31 1525) Weight:  [98.1 kg] 98.1 kg (10/31 0844) General: \Awake, alert, cooperative, brushing teeth, in NAD Head: Normocephalic and atruamatic HEENT: Neck supple Pulmonary: breathing  room air comfortably, no evidence of increased work of breathing Cardiac: RRR Abdomen: S NT ND Extremities: Warm and well perfused x4 Neuro: AOx3, PERRL, EOMI, FS Strength 5/5 x4, SILTx4, no drift   Assessment and Plan: 45 y.o. man with new onset seizures. MRI brain personally reviewed, which shows scattered left hemispheric areas of cortical enhancement, one notable location in the left insula, appears to spare the right hemisphere and posterior fossa. Fairly extensive workup without diagnosis.  -discussed with the patient at length today, I think a biopsy is needed to obtain a diagnosis to guide treatment. We discussed what this would entail and that his co-morbidities make him higher risk for perioperative issue, but that it is generally well tolerated. Will plan on a stereotactically guided open biopsy to allow for inclusion of some dura with the cortical biopsy, centered over the disease in his post-central gyrus. We discussed risks of this at length including the possibility for symptoms from the biopsy itself as well as associated issues that could cause deficit and he wishes to proceed, will schedule for 11/2 afternoon. -will need a volumetric CTH for intra-op stereotaxy, will order -please call with any concerns or questions, will make NPO after midnight preop  Judith Part, MD 05/02/21 3:59 PM [facility]

## 2021-05-02 NOTE — Progress Notes (Signed)
PT Cancellation Note  Patient Details Name: Jeremy Burns MRN: [id number] DOB: 03-Dec-1975   Cancelled Treatment:    Reason Eval/Treat Not Completed: Patient at procedure or test/unavailable. Checked on pt x2 this morning and off unit for procedure. Will check back as schedule allows to continue with PT POC.    Thelma Comp 05/02/2021, 10:34 AM  Rolinda Roan, PT, DPT Acute Rehabilitation Services Pager: [phone] Office: [phone]

## 2021-05-02 NOTE — Transfer of Care (Signed)
Immediate Anesthesia Transfer of Care Note  Patient: Jeremy Burns  Procedure(s) Performed: COLONOSCOPY POLYPECTOMY BIOPSY  Patient Location: PACU  Anesthesia Type:MAC  Level of Consciousness: drowsy, patient cooperative and responds to stimulation  Airway & Oxygen Therapy: Patient Spontanous Breathing and Patient connected to face mask oxygen  Post-op Assessment: Report given to RN and Post -op Vital signs reviewed and stable  Post vital signs: Reviewed and stable  Last Vitals:  Vitals Value Taken Time  BP 90/68 05/02/21 0956  Temp    Pulse 103 05/02/21 0958  Resp 16 05/02/21 0958  SpO2 96 % 05/02/21 0958  Vitals shown include unvalidated device data.  Last Pain:  Vitals:   05/02/21 0844  TempSrc: Temporal  PainSc: 0-No pain      Patients Stated Pain Goal: 0 (40/35/24 8185)  Complications: No notable events documented.

## 2021-05-02 NOTE — TOC Initial Note (Signed)
Transition of Care [facility]) - Initial/Assessment Note    Patient Details  Name: Jeremy Burns MRN: [id number] Date of Birth: 1976/01/22  [facility] CM/SW Contact:    Pollie Friar, RN Phone Number: 05/02/2021, 3:04 PM  Clinical Narrative:                 Patient is from [place], [place]. He lives with a friend and his 44 year old daughter. Pt denies issues with transportation or home medications.  PCP: Earl Currently therapies recommending outpatient therapy. CM will follow to see if still needs closer to d/c. Pt is agreeable if still needed at d/c.  Pt states he will have transportation back to [facility] when ready for d/c.  TOC following.  Expected Discharge Plan: OP Rehab Barriers to Discharge: Continued Medical Work up   Patient Goals and CMS Choice     Choice offered to / list presented to : Patient  Expected Discharge Plan and Services Expected Discharge Plan: OP Rehab   Discharge Planning Services: CM Consult   Living arrangements for the past 2 months: Single Family Home                                      Prior Living Arrangements/Services Living arrangements for the past 2 months: Single Family Home Lives with:: Significant Other, Minor Children Patient language and need for interpreter reviewed:: Yes Do you feel safe going back to the place where you live?: Yes        Care giver support system in place?: Yes (comment)   Criminal Activity/Legal Involvement Pertinent to Current Situation/Hospitalization: No - Comment as needed  Activities of Daily Living      Permission Sought/Granted                  Emotional Assessment Appearance:: Appears stated age Attitude/Demeanor/Rapport: Engaged Affect (typically observed): Accepting Orientation: : Oriented to Self, Oriented to Place, Oriented to  Time, Oriented to Situation   Psych Involvement: No (comment)  Admission diagnosis:  Seizure [facility]) [R56.9] Patient  Active Problem List   Diagnosis Date Noted   Noninfectious gastroenteritis    History of colonic polyps    Seizure ([place]) 04/26/2021   PCP:  Kathyrn Lass Pharmacy:   [facility] [address] [place] [address] Phone: [phone] Fax: [phone]     Social Determinants of Health (SDOH) Interventions    Readmission Risk Interventions No flowsheet data found.

## 2021-05-02 NOTE — Op Note (Signed)
[facility] Patient Name: Jeremy Burns Procedure Date : 05/02/2021 MRN: [id number] Attending MD: Georgian Co ,  Date of Birth: [birth year] CSN: [id number] Age: 45 Admit Type: Inpatient Procedure:                Colonoscopy Indications:              This is the patient's first colonoscopy, Abnormal                            CT of the GI tract Providers:                Sonny Masters "Darra Lis Person, Frazier Richards,                            Technician Referring MD:             Hospitalist team Medicines:                Monitored Anesthesia Care Complications:            No immediate complications. Estimated Blood Loss:     Estimated blood loss was minimal. Procedure:                Pre-Anesthesia Assessment:                           - Prior to the procedure, a History and Physical                            was performed, and patient medications and                            allergies were reviewed. The patient's tolerance of                            previous anesthesia was also reviewed. The risks                            and benefits of the procedure and the sedation                            options and risks were discussed with the patient.                            All questions were answered, and informed consent                            was obtained. Prior Anticoagulants: The patient has                            taken Lovenox (enoxaparin), last dose was 1 day                            prior to procedure. ASA Grade Assessment: III - A  patient with severe systemic disease. After                            reviewing the risks and benefits, the patient was                            deemed in satisfactory condition to undergo the                            procedure.                           After obtaining informed consent, the colonoscope                            was passed under direct vision. Throughout  the                            procedure, the patient's blood pressure, pulse, and                            oxygen saturations were monitored continuously. The                            CF-HQ190L ([id number]) Olympus colonoscope was                            introduced through the anus and advanced to the the                            terminal ileum. The colonoscopy was performed                            without difficulty. The patient tolerated the                            procedure well. The quality of the bowel                            preparation was adequate. Scope In: 9:22:52 AM Scope Out: 9:03:00 AM Scope Withdrawal Time: 0 hours 18 minutes 48 seconds  Total Procedure Duration: 0 hours 23 minutes 26 seconds  Findings:      The terminal ileum appeared normal.      Multiple small and large-mouthed diverticula were found in the entire       colon.      Multiple ulcers with surrounding inflammation were found at the hepatic       flexure, in the ascending colon and in the cecum. No bleeding was       present. Biopsies were taken with a cold forceps for histology.      Two sessile polyps were found in the transverse colon and cecum. The       polyps were 2 mm in size. These polyps were removed with a jumbo cold       forceps. Resection and retrieval were complete.      A  4 mm polyp was found in the ascending colon. The polyp was sessile.       The polyp was removed with a cold snare. Resection and retrieval were       complete.      Internal hemorrhoids were found during retroflexion. Impression:               - The examined portion of the ileum was normal.                           - Diverticulosis in the entire examined colon.                           - Multiple ulcers with surrounding inflammation at                            the hepatic flexure, in the ascending colon and in                            the cecum. Biopsied.                           - Two 2 mm polyps in  the transverse colon and in                            the cecum, removed with a jumbo cold forceps.                            Resected and retrieved.                           - One 4 mm polyp in the ascending colon, removed                            with a cold snare. Resected and retrieved.                           - Internal hemorrhoids. Recommendation:           - Return patient to hospital ward for ongoing care.                           - It is suspected that the patient's thickening on                            CT scan is due to inflammation seen in the right                            side of the colon. This could represent ischemia,                            infection, or medication effects. Have also asked                            pathology to rule out amyloidosis  in the setting of                            the patient's known heart failure.                           - Avoid NSAIDs.                           - Await pathology results.                           - The findings and recommendations were discussed                            with the patient and/or primary team. Procedure Code(s):        --- Professional ---                           [phone], Colonoscopy, flexible; with removal of                            tumor(s), polyp(s), or other lesion(s) by snare                            technique                           45380, 59, Colonoscopy, flexible; with biopsy,                            single or multiple Diagnosis Code(s):        --- Professional ---                           K63.3, Ulcer of intestine                           K63.5, Polyp of colon                           K57.30, Diverticulosis of large intestine without                            perforation or abscess without bleeding                           R93.3, Abnormal findings on diagnostic imaging of                            other parts of digestive tract CPT copyright 2019 American Medical  Association. All rights reserved. The codes documented in this report are preliminary and upon coder review may  be revised to meet current compliance requirements. Sonny Masters "Christia Reading,  05/02/2021 10:00:12 AM Number of Addenda: 0

## 2021-05-02 NOTE — Anesthesia Postprocedure Evaluation (Signed)
Anesthesia Post Note  Patient: Jeremy Burns  Procedure(s) Performed: COLONOSCOPY POLYPECTOMY BIOPSY     Patient location during evaluation: Endoscopy Anesthesia Type: MAC Level of consciousness: awake Pain management: pain level controlled Vital Signs Assessment: post-procedure vital signs reviewed and stable Respiratory status: spontaneous breathing, nonlabored ventilation, respiratory function stable and patient connected to nasal cannula oxygen Cardiovascular status: stable and blood pressure returned to baseline Postop Assessment: no apparent nausea or vomiting Anesthetic complications: no   No notable events documented.  Last Vitals:  Vitals:   05/02/21 1210 05/02/21 1525  BP: 110/66 113/87  Pulse: 96 71  Resp: 14 14  Temp: 36.5 C 36.9 C  SpO2: 99% 100%    Last Pain:  Vitals:   05/02/21 1525  TempSrc: Oral  PainSc:                  Lanise Mergen P Naiomy Watters

## 2021-05-02 NOTE — Progress Notes (Addendum)
Subjective: Denies any further seizures overnight.  Denies any side effect on Keppra.  Denies any other concerns.  ROS: negative except above  Examination  Vital signs in last 24 hours: Temp:  [97.6 F (36.4 C)-98.2 F (36.8 C)] 97.7 F (36.5 C) (10/31 1210) Pulse Rate:  [63-96] 96 (10/31 1210) Resp:  [12-20] 14 (10/31 1210) BP: (86-133)/(60-90) 110/66 (10/31 1210) SpO2:  [93 %-100 %] 99 % (10/31 1210) Weight:  [98.1 kg] 98.1 kg (10/31 0844)  General: lying in bed, NAD CVS: pulse-normal rate and rhythm RS: breathing comfortably, CTAB Extremities: normal, warm   Neuro: MS: Alert, oriented, follows commands CN: pupils equal and reactive,  EOMI, face symmetric, tongue midline, normal sensation over face, Motor: 5/5 strength in all 4 extremities Reflexes: 2+ bilaterally over patella, biceps, plantars: flexor Coordination: normal Gait: not tested  Basic Metabolic Panel: Recent Labs  Lab 04/26/21 1344 04/26/21 1445 04/27/21 0343 04/28/21 0245 04/30/21 0618 05/01/21 0319  NA  --  135 138 133* 134* 136  K  --  4.5 3.5 3.4* 4.1 3.9  CL  --  104 106 105 101 104  CO2  --  19* 24 22 23 25   GLUCOSE  --  273* 211* 283* 187* 240*  BUN  --  10 11 11 9 15   CREATININE  --  0.91 0.89 0.98 0.95 1.02  CALCIUM  --  8.9 8.9 8.5* 9.0 8.9  MG 2.2  --   --   --   --   --   PHOS 3.0  --   --   --   --   --     CBC: Recent Labs  Lab 04/26/21 1002 04/26/21 1009 04/27/21 0343 04/28/21 0245 04/30/21 0618 05/01/21 0319  WBC 6.0  --  6.7 7.4 7.2 7.1  NEUTROABS 4.4  --  4.0  --   --   --   HGB 15.6 16.7 14.4 14.0 14.5 14.0  HCT 47.0 49.0 42.9 43.0 44.3 43.5  MCV 90.0  --  89.6 91.1 90.8 91.2  PLT 286  --  274 239 251 252     Coagulation Studies: No results for input(s): LABPROT, INR in the last 72 hours.  Imaging CT chest Abdo pelvis with contrast 04/29/2021:  Focal luminal narrowing and wall thickening within the ascending colon near the hepatic flexor. While this could  represent focal peristalsis, recommend correlation with colonoscopy, if not recently performed, to rule out underlying colonic neoplasm. Focal skin thickening near the left axilla, recommend correlation with direct visualization.  Markedly dilated left ventricle.  Mild pulmonary edema.   Solid 3 mm right apical subpleural pulmonary nodule, of uncertain clinical significance.   ASSESSMENT AND PLAN: 45 year old male with past medical history of diabetes type 2, combined systolic and diastolic congestive), coronary artery disease, AICD placement, CKD stage I, mixed hyperlipidemia, hypertension who was found down by EMS.  He had a 45-second seizure in route and another 2-minute seizure in the ED described as right-sided twitching and loss of consciousness.  Also reported having trouble writing with his right hand as well as episodes of staring off since morning. EEG showed seizures arising from left frontal-anterior temporal region.  MRI brain with and without contrast showed multiple enhancing parenchymal and leptomeningeal lesion in the left frontoparietal and insular region, nonspecific. Differential diagnosis include leptomeningeal processes such as infection and granulomatous disease versus malignancy. No significant mass effect or hemorrhage.   Focal seizures, new onset Brain lesions -No further seizures overnight.  Recommendations -Continue Keppra 1500 mg twice daily.  If any further seizures, can increase to 2000 mg twice daily -If patient has frequent seizures, can  load with Vimpat -Recommend ID consult to see if MRI lesions could be due to an atypical infection - Consulted neurosurgery and spoke with Dr. Zada Finders, plan to biopsy the postcentral gyrus lesion most likely on Wednesday -management of rest of comorbidities per primary team   I have spent a total of 28 minutes with the patient reviewing hospital notes,  test results, labs and examining the patient as well as establishing an  assessment and plan that was discussed personally with the patient and Dr Broadus John.  > 50% of time was spent in direct patient care.  Zeb Comfort Epilepsy [facility] For questions after 5pm please refer to AMION to reach the Neurologist on call

## 2021-05-02 NOTE — Interval H&P Note (Signed)
History and Physical Interval Note:  05/02/2021 9:13 AM  Jeremy Burns  has presented today for surgery, with the diagnosis of Abnormal CT of the colon.  The various methods of treatment have been discussed with the patient and family. After consideration of risks, benefits and other options for treatment, the patient has consented to  Procedure(s): COLONOSCOPY (N/A) as a surgical intervention.  The patient's history has been reviewed, patient examined, no change in status, stable for surgery.  I have reviewed the patient's chart and labs.  Questions were answered to the patient's satisfaction.     [name]

## 2021-05-03 ENCOUNTER — Inpatient Hospital Stay (HOSPITAL_COMMUNITY): Payer: Medicare PPO

## 2021-05-03 DIAGNOSIS — Z86010 Personal history of colonic polyps: Secondary | ICD-10-CM | POA: Diagnosis not present

## 2021-05-03 DIAGNOSIS — C7931 Secondary malignant neoplasm of brain: Secondary | ICD-10-CM

## 2021-05-03 DIAGNOSIS — G939 Disorder of brain, unspecified: Secondary | ICD-10-CM

## 2021-05-03 DIAGNOSIS — R569 Unspecified convulsions: Secondary | ICD-10-CM | POA: Diagnosis not present

## 2021-05-03 LAB — GLUCOSE, CAPILLARY
Glucose-Capillary: 290 mg/dL — ABNORMAL HIGH (ref 70–99)
Glucose-Capillary: 283 mg/dL — ABNORMAL HIGH (ref 70–99)
Glucose-Capillary: 175 mg/dL — ABNORMAL HIGH (ref 70–99)
Glucose-Capillary: 186 mg/dL — ABNORMAL HIGH (ref 70–99)
Glucose-Capillary: 274 mg/dL — ABNORMAL HIGH (ref 70–99)

## 2021-05-03 LAB — TOXOPLASMA ANTIBODIES- IGG AND  IGM
Toxoplasma Antibody- IgM: <3 AU/mL (ref 0.0–7.9)
Toxoplasma IgG Ratio: <3 IU/mL (ref 0.0–7.1)

## 2021-05-03 LAB — ANGIOTENSIN CONVERTING ENZYME, CSF: Angio Convert Enzyme: 1.8 U/L (ref 0.0–2.5)

## 2021-05-03 LAB — VZV PCR, CSF: VZV PCR, CSF: NEGATIVE

## 2021-05-03 LAB — ANGIOTENSIN CONVERTING ENZYME: Angiotensin-Converting Enzyme: 55 U/L (ref 14–82)

## 2021-05-03 NOTE — Progress Notes (Addendum)
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 -In the emergency room he was noted to have CBG greater than 600 without DKA -Seen by neurology, loaded with Keppra,  -Had multiple seizures earlier this admission, Keppra dose was increased, MRI noted multiple enhancing parenchymal brain lesions etiology of which remains unclear, work-up ongoing, LP was nondiagnostic -Also underwent colonoscopy, this was negative for malignancy -Neurosurgery consulting, plan for stereotactically guided open biopsy 11/2  Assessment & Plan:  New onset seizures Abnormal MRI-multiple enhancing parenchymal lesions -EEG abnormal noted epileptiform activity arising from left frontal anterior temporal region -Neurology following, LTM with 1 seizure overnight, Keppra dose increased -Clinically much improved, stable on current dose -MRI brain concerning for multiple enhancing parenchymal lesions, etiology is unclear, malignancy vs inflammation, atypical infection is also a possibility but CSF cell counts do not suggest this -HIV negative, JC virus and VDRL pending -Neurology following, LP noted increased CSF glucose and protein, viral studies pending -CT chest abdomen pelvis with contrast noted focal thickening in the hepatic flexure of colon, could be peristalsis related, requested GI eval, underwent colonoscopy which was negative for malignancy, nonspecific ulcers noted in the hepatic flexure, 2 polyps removed -Appreciate ID consult, QuantiFERON-TB gold and toxo antibodies pending, ACE level is normal -Neurosurgery consulting, plan for  stereotactic open brain biopsy  Uncontrolled type 2 diabetes mellitus Hyperosmolar nonketotic hyperglycemia -CBG> 600 on admission, without DKA, patient does not remember missing any insulin doses -At baseline on Levemir 55 units twice daily and NovoLog 3 times daily with meals, metformin on hold -CBGs are stable, continue current dose of Semglee, meal coverage on hold, n.p.o. for biopsy tomorrow -HbA1c> 15.5  History of CAD History of chronic systolic and diastolic CHF with AICD -EF unknown, followed by cardiology at outside hospital -continue carvedilol, amiodarone, statin -Lasix and Aldactone on hold, volume status remains stable without diuretics  Colonic Ulcers -Colonoscopy noted multiple ulcers with surrounding inflammation hepatic flexure and 2 transverse colon polyps which were resected -Etiology is unclear but clinically asymptomatic -Follow-up pathology  R lung nodule -needs FU  Hypertension -BP soft, hold Cozaar  DVT prophylaxis: Hold Lovenox for biopsy Code Status: Full code Family Communication: no family at bedside, called and updated aunt Dianah Field [phone] on 10/31--per pts request Disposition Plan:  Status is: Inpatient  Remains inpatient appropriate because: Severity of illness   Consultants:  Neurology IR Infectious disease Neurosurgery  Procedures: Lumbar puncture 10/28  Antimicrobials:    Subjective: -Feels okay, denies any complaints  Objective: Vitals:   05/03/21 0100 05/03/21 0400 05/03/21 0801 05/03/21 1241  BP: 101/69 110/68 115/76 115/77  Pulse: 63 72 73 73  Resp: _0 Temp: 97.9 F (36.6 C) 98.2 F (36.8 C) 98.2 F (36.8 C) 98.6 F (37 C)  TempSrc: Axillary Oral Oral Oral  SpO2: 98% 94% 98% 98%  Weight:      Height:       No intake or output data in the 24 hours ending 05/03/21 1359  Filed Weights   04/26/21 1700 04/27/21 1513 05/02/21 0844  Weight: 42.6 kg 98.1 kg 98.1 kg  Examination:  General exam: Gen:  Awake, Alert, Oriented X 3, no distress HEENT: no JVD Lungs: Good air movement bilaterally, CTAB CVS: S1S2/RRR Abd: soft, Non tender, non distended, BS present Extremities: No edema Skin: no new rashes on exposed skin Psychiatry: Mood & affect appropriate.     Data Reviewed:   CBC: Recent Labs  Lab 04/27/21 0343 04/28/21 0245 04/30/21 0618 05/01/21 0319  WBC 6.7 7.4 7.2 7.1  NEUTROABS 4.0  --   --   --   HGB 14.4 14.0 14.5 14.0  HCT 42.9 43.0 44.3 43.5  MCV 89.6 91.1 90.8 91.2  PLT 274 239 251 591   Basic Metabolic Panel: Recent Labs  Lab 04/26/21 1445 04/27/21 0343 04/28/21 0245 04/30/21 0618 05/01/21 0319  NA 135 138 133* 134* 136  K 4.5 3.5 3.4* 4.1 3.9  CL 104 106 105 101 104  CO2 19* _0 GLUCOSE 273* 211* 283* 187* 240*  BUN _1 CREATININE 0.91 0.89 0.98 0.95 1.02  CALCIUM 8.9 8.9 8.5* 9.0 8.9   GFR: Estimated Creatinine Clearance: 108.5 mL/min (by C-G formula based on SCr of 1.02 mg/dL). Liver Function Tests: Recent Labs  Lab 05/01/21 0319  AST 14*  ALT 15  ALKPHOS 67  BILITOT 0.4  PROT 6.0*  ALBUMIN 2.9*   No results for input(s): LIPASE, AMYLASE in the last 168 hours. No results for input(s): AMMONIA in the last 168 hours. Coagulation Profile: Recent Labs  Lab 04/28/21 1025  INR 1.0   Cardiac Enzymes: No results for input(s): CKTOTAL, CKMB, CKMBINDEX, TROPONINI in the last 168 hours. BNP (last 3 results) No results for input(s): PROBNP in the last 8760 hours. HbA1C: No results for input(s): HGBA1C in the last 72 hours.  CBG: Recent Labs  Lab 05/02/21 1207 05/02/21 1706 05/02/21 2123 05/03/21 0652 05/03/21 1205  GLUCAP 175* 109* 290* 283* 175*   Lipid Profile: No results for input(s): CHOL, HDL, LDLCALC, TRIG, CHOLHDL, LDLDIRECT in the last 72 hours.  Thyroid Function Tests: No results for input(s): TSH, T4TOTAL, FREET4, T3FREE, THYROIDAB in the last 72 hours. Anemia Panel: No results for input(s):  VITAMINB12, FOLATE, FERRITIN, TIBC, IRON, RETICCTPCT in the last 72 hours. Urine analysis:    Component Value Date/Time   COLORURINE STRAW (A) 04/26/2021 1030   APPEARANCEUR CLEAR 04/26/2021 1030   LABSPEC 1.026 04/26/2021 1030   PHURINE 6.0 04/26/2021 1030   GLUCOSEU >=500 (A) 04/26/2021 1030   HGBUR NEGATIVE 04/26/2021 1030   [place] 04/26/2021 1030   [place] 04/26/2021 1030   PROTEINUR 100 (A) 04/26/2021 1030   NITRITE NEGATIVE 04/26/2021 1030   LEUKOCYTESUR NEGATIVE 04/26/2021 1030   Sepsis Labs: _2 (procalcitonin:4,lacticidven:4)  ) Recent Results (from the past 240 hour(s))  VZV PCR, CSF     Status: None   Collection Time: 04/29/21 10:29 AM   Specimen: Cerebrospinal Fluid  Result Value Ref Range Status   VZV PCR, CSF Negative Negative Final    Comment: (NOTE) No Varicella Zoster Virus DNA detected. Performed At: [facility] [place], [place] [id number] Rush Farmer MD JT:[id number]   CSF culture w Gram Stain     Status: None   Collection Time: 04/29/21 10:29 AM   Specimen: CSF  Result Value Ref Range Status   Specimen Description CSF  Final   Special Requests NONE  Final   Gram Stain   Final    WBC PRESENT, PREDOMINANTLY MONONUCLEAR NO ORGANISMS SEEN CYTOSPIN SMEAR  Culture   Final    NO GROWTH 3 DAYS Performed at [facility], [place] [address] [place], [place] [address]    Report Status 05/02/2021 FINAL  Final      Radiology Studies: CT BRAINLAB HEAD W/O CONTRAST (1MM)  Result Date: 05/03/2021 CLINICAL DATA:  45 year old male with seizure. Abnormal parenchymal and/or leptomeningeal enhancement in the posterior left insula, posterior left frontal and parietal lobes on recent MRI. Stereotactic surgical planning. EXAM: CT HEAD WITHOUT CONTRAST TECHNIQUE: Contiguous axial images were obtained from the base of the skull through the vertex without intravenous contrast. COMPARISON:  Brain MRI  04/27/2021.  Head CT 04/26/2021. FINDINGS: Brain: Vague asymmetric cortical hypodensity at the left parietal lobe on series 5, image 22 peers to correspond to the area of abnormal enhancement by MRI. This is on series 3, image 106 and 109. See also sagittal series 7, image 91. The subtle hypodensity at the posterior left insula by CT is less apparent but probably on series 3, image 85. No cerebral vasogenic edema or mass effect. No midline shift or ventriculomegaly. No acute intracranial hemorrhage identified. No new areas of abnormal gray-white matter differentiation. Vascular: Mild Calcified atherosclerosis at the skull base. No suspicious intracranial vascular hyperdensity. Skull: Negative; small benign right anterior frontal bone exostosis on series 4, image 65. Sinuses/Orbits: Visualized paranasal sinuses and mastoids are stable and well aerated. Other: Visualized orbits and scalp soft tissues are within normal limits. IMPRESSION: 1. Study for stereotactic surgical planning. Abnormal left hemisphere cortical areas by MRI are visible on series 3, image 109 (left parietal lobe) and 85 (posterior insula). 2. No associated vasogenic edema or mass effect. No new intracranial abnormality identified. Electronically Signed   By: Genevie Ann M.D.   On: 05/03/2021 08:29    Scheduled Meds:   stroke: mapping our early stages of recovery book   Does not apply Once   amiodarone  200 mg Oral Daily   aspirin EC  81 mg Oral Daily   atorvastatin  80 mg Oral Daily   carvedilol  25 mg Oral BID WC   chlorhexidine gluconate (MEDLINE KIT)  15 mL Mouth Rinse BID   enoxaparin (LOVENOX) injection  40 mg Subcutaneous Q24H   insulin aspart  0-15 Units Subcutaneous TID WC   insulin aspart  0-5 Units Subcutaneous QHS   insulin glargine-yfgn  45 Units Subcutaneous Daily   levETIRAcetam  1,500 mg Oral BID   mouth rinse  15 mL Mouth Rinse 10 times per day   Continuous Infusions:  sodium chloride 10 mL/hr at 05/01/21 1347      LOS: 7 days    Time spent: 65mn  PDomenic Polite MD [facility]   05/03/2021, 1:59 PM

## 2021-05-03 NOTE — Progress Notes (Signed)
Neurosurgery Service Progress Note  Subjective: No acute events overnight, no clinical seizures overnight but one reported electrographic Sz  Objective: Vitals:   05/03/21 0801 05/03/21 1241 05/03/21 1545 05/03/21 1947  BP: 115/76 115/77 109/78 109/77  Pulse: 73 73 73 79  Resp: 15 16 15 15   Temp: 98.2 F (36.8 C) 98.6 F (37 C) 97.7 F (36.5 C) 98.5 F (36.9 C)  TempSrc: Oral Oral Oral Oral  SpO2: 98% 98% 97% 100%  Weight:      Height:        Physical Exam: AOx3, PERRL, EOMI, FS, TM, Strength 5/5 x4, SILTx4, no drift  Assessment & Plan: 45 y.o.  man with new onset seizures, MRI with multifocal left hemipsheric enhancement with minimal edema.  -OR tomorrow for Sx open biopsy, NPO p MN, will need to go to ICU overnight for monitoring post-op, then can return to the floor the morning after surgery if he does well -reviewed Dr. Derek Mound note, will send for AFB/fungal/aerobe/anaerobe as well as regular pah assuming sufficient specimen is obtainable  Judith Part  05/03/21 9:24 PM

## 2021-05-03 NOTE — Progress Notes (Signed)
[place] for Infectious Disease  Date of Admission:  04/26/2021     Jeremy Burns is a 45 y.o. male living with uncontrolled T2DM, HTN, CKD Stage 1, Chronic Combined Systolic and Diastolic CHF with ICD ( SX<11%) on hospital day 7 for new onset of seizure. Differential includes malignancy , infection, and autoimmune causes at this time. ACE levels returned normal. Ultimately will need brain biopsy which has been scheduled for 11/2.   ASSESSMENT: Multiple enhancing parenchymal and leptomeningeal lesion in left frontoparietal insular area New onset seizures Areas of colonic ulceration with inflammation seen on endoscopy Chronic combined Systolic and Diastolic CHF with AICD CKD Stage 1 Uncontrolled Type 2 Diabetes Mellitus HTN   PLAN: 10/31 Biopsy from Colon - F/u Biopsy results Scheduled with Neurosurgery for brain biopsy on 11/2. Will need brain biopsy with tissue to pathology, dedicated specimen sent for AFB stain and culture, fungal stain and culture, and bacterial stain and culture Quantiferon gold n process. Urine histo in process.   Active Problems:   Seizure ([place])   Noninfectious gastroenteritis   History of colonic polyps     stroke: mapping our early stages of recovery book   Does not apply Once   amiodarone  200 mg Oral Daily   aspirin EC  81 mg Oral Daily   atorvastatin  80 mg Oral Daily   carvedilol  25 mg Oral BID WC   chlorhexidine gluconate (MEDLINE KIT)  15 mL Mouth Rinse BID   enoxaparin (LOVENOX) injection  40 mg Subcutaneous Q24H   insulin aspart  0-15 Units Subcutaneous TID WC   insulin aspart  0-5 Units Subcutaneous QHS   insulin glargine-yfgn  45 Units Subcutaneous Daily   levETIRAcetam  1,500 mg Oral BID   mouth rinse  15 mL Mouth Rinse 10 times per day    SUBJECTIVE: No new complaints . Patient is aware for plan of surgery. His wife is no longer living . Discussed her history of TB and he believes she was asymptomatic when they were  together.    OBJECTIVE: Vitals:   05/03/21 0100 05/03/21 0400 05/03/21 0801 05/03/21 1241  BP: 101/69 110/68 115/76 115/77  Pulse: 63 72 73 73  Resp: '14 16 15 16  ' Temp: 97.9 F (36.6 C) 98.2 F (36.8 C) 98.2 F (36.8 C) 98.6 F (37 C)  TempSrc: Axillary Oral Oral Oral  SpO2: 98% 94% 98% 98%  Weight:      Height:       Body mass index is 31.03 kg/m.  Physical Exam Constitutional:      Appearance: Normal appearance.  HENT:     Head: Normocephalic and atraumatic.  Pulmonary:     Effort: Pulmonary effort is normal. No respiratory distress.  Skin:    General: Skin is warm and dry.  Neurological:     Mental Status: He is alert and oriented to person, place, and time. Mental status is at baseline.  Psychiatric:        Mood and Affect: Mood normal.        Behavior: Behavior normal.        Thought Content: Thought content normal.    Lab Results Lab Results  Component Value Date   WBC 7.1 05/01/2021   HGB 14.0 05/01/2021   HCT 43.5 05/01/2021   MCV 91.2 05/01/2021   PLT 252 05/01/2021    Lab Results  Component Value Date   CREATININE 1.02 05/01/2021   BUN 15 05/01/2021   NA  136 05/01/2021   K 3.9 05/01/2021   CL 104 05/01/2021   CO2 25 05/01/2021    Lab Results  Component Value Date   ALT 15 05/01/2021   AST 14 (L) 05/01/2021   ALKPHOS 67 05/01/2021   BILITOT 0.4 05/01/2021     Microbiology: Recent Results (from the past 240 hour(s))  CSF culture w Gram Stain     Status: None   Collection Time: 04/29/21 10:29 AM   Specimen: CSF  Result Value Ref Range Status   Specimen Description CSF  Final   Special Requests NONE  Final   Gram Stain   Final    WBC PRESENT, PREDOMINANTLY MONONUCLEAR NO ORGANISMS SEEN CYTOSPIN SMEAR    Culture   Final    NO GROWTH 3 DAYS Performed at [facility], [address], [place], [place] [address]    Report Status 05/02/2021 FINAL  Final    Tamsen Snider, MD PGY3 Internal Medicine [phone]

## 2021-05-03 NOTE — Progress Notes (Signed)
Speech Language Pathology Treatment: Cognitive-Linquistic  Patient Details Name: Jeremy Burns MRN: [id number] DOB: 1975-08-26 Today's Date: 05/03/2021 Time: 9574-7340 SLP Time Calculation (min) (ACUTE ONLY): 18 min  Assessment / Plan / Recommendation Clinical Impression  Pt demonstrates improvements in cognition since initial assessment 10/26 - immediate and delayed verbal recall were [facility]. Demonstrated improved reasoning, mental calculations, and functional problem solving.  Communication, speech, language are WNL.  No further issues identified that would warrant SLP f/u.  Our service will sign off.   HPI HPI: 45yo male admitted 04/26/21 with new onset seizures; found down in his tractor trailer.  MRI: scattered left hemispheric areas of cortical enhancement, one notable location in the left insula, appears to spare the right hemisphere and posterior fossa. Brain biopsy pending 11/2. PMH: DM2, cardiomyopathy/chronic stystolic CHF, AICD, CKD2, HTN, HLD, Paroxysmal VT.      Goals met.   Recommendations for follow up therapy are one component of a multi-disciplinary discharge planning process, led by the attending physician.  Recommendations may be updated based on patient status, additional functional criteria and insurance authorization.    Recommendations   No SLP f/u needed.                Follow up Recommendations:  (tba) SLP Visit Diagnosis: Cognitive communication deficit (R41.841) Plan: goals met; no slp f/u                    Jeremy Burns L. Jeremy Burns, Jeremy Burns CCC/SLP Acute Rehabilitation Services Office number [phone] Pager [phone]    Jeremy Burns Jeremy Burns  05/03/2021, 1:29 PM

## 2021-05-03 NOTE — Care Management Important Message (Signed)
Important Message  Patient Details  Name: Jeremy Burns MRN: [id number] Date of Birth: 11/09/1975   Medicare Important Message Given:  Yes     [name] [address] 05/03/2021, 2:33 PM

## 2021-05-04 ENCOUNTER — Encounter (HOSPITAL_COMMUNITY)
Admission: EM | Disposition: A | Discharge status: Home / Self Care | Payer: Self-pay | Source: Home / Self Care | Attending: Internal Medicine

## 2021-05-04 ENCOUNTER — Inpatient Hospital Stay (HOSPITAL_COMMUNITY): Payer: Medicare PPO | Admitting: Certified Registered"

## 2021-05-04 ENCOUNTER — Encounter (HOSPITAL_COMMUNITY): Payer: Self-pay | Admitting: Internal Medicine

## 2021-05-04 ENCOUNTER — Inpatient Hospital Stay (HOSPITAL_COMMUNITY): Payer: Medicare PPO

## 2021-05-04 HISTORY — PX: FRAMELESS  BIOPSY WITH BRAINLAB: SHX6879

## 2021-05-04 LAB — TYPE AND SCREEN
ABO/RH(D): O POS
Antibody Screen: NEGATIVE

## 2021-05-04 LAB — BASIC METABOLIC PANEL
Anion gap: 9 (ref 5–15)
BUN: 9 mg/dL (ref 6–20)
CO2: 23 mmol/L (ref 22–32)
Calcium: 8.7 mg/dL — ABNORMAL LOW (ref 8.9–10.3)
Chloride: 102 mmol/L (ref 98–111)
Creatinine, Ser: 1.22 mg/dL (ref 0.61–1.24)
GFR, Estimated: >60 mL/min (ref >60)
Glucose, Bld: 356 mg/dL — ABNORMAL HIGH (ref 70–99)
Potassium: 4.1 mmol/L (ref 3.5–5.1)
Sodium: 134 mmol/L — ABNORMAL LOW (ref 135–145)

## 2021-05-04 LAB — GLUCOSE, CAPILLARY
Glucose-Capillary: 351 mg/dL — ABNORMAL HIGH (ref 70–99)
Glucose-Capillary: 139 mg/dL — ABNORMAL HIGH (ref 70–99)
Glucose-Capillary: 115 mg/dL — ABNORMAL HIGH (ref 70–99)
Glucose-Capillary: 124 mg/dL — ABNORMAL HIGH (ref 70–99)
Glucose-Capillary: 232 mg/dL — ABNORMAL HIGH (ref 70–99)

## 2021-05-04 LAB — CREATININE, SERUM
Creatinine, Ser: 0.92 mg/dL (ref 0.61–1.24)
GFR, Estimated: >60 mL/min (ref >60)

## 2021-05-04 LAB — CBC
HCT: 40.4 % (ref 39.0–52.0)
HCT: 42 % (ref 39.0–52.0)
Hemoglobin: 12.9 g/dL — ABNORMAL LOW (ref 13.0–17.0)
Hemoglobin: 13.6 g/dL (ref 13.0–17.0)
MCH: 29.6 pg (ref 26.0–34.0)
MCH: 30 pg (ref 26.0–34.0)
MCHC: 31.9 g/dL (ref 30.0–36.0)
MCHC: 32.4 g/dL (ref 30.0–36.0)
MCV: 92.7 fL (ref 80.0–100.0)
MCV: 92.5 fL (ref 80.0–100.0)
Platelets: 244 10*3/uL (ref 150–400)
Platelets: 266 10*3/uL (ref 150–400)
RBC: 4.36 MIL/uL (ref 4.22–5.81)
RBC: 4.54 MIL/uL (ref 4.22–5.81)
RDW: 13.3 % (ref 11.5–15.5)
RDW: 13.5 % (ref 11.5–15.5)
WBC: 6.2 10*3/uL (ref 4.0–10.5)
WBC: 7.7 10*3/uL (ref 4.0–10.5)
nRBC: 0 % (ref 0.0–0.2)
nRBC: 0 % (ref 0.0–0.2)

## 2021-05-04 LAB — ABO/RH: ABO/RH(D): O POS

## 2021-05-04 LAB — SURGICAL PATHOLOGY

## 2021-05-04 LAB — SARS CORONAVIRUS 2 BY RT PCR (HOSPITAL ORDER, PERFORMED IN ~~LOC~~ HOSPITAL LAB): SARS Coronavirus 2: NEGATIVE

## 2021-05-04 LAB — JC VIRUS, PCR CSF: JC Virus PCR, CSF: NEGATIVE

## 2021-05-04 LAB — MRSA NEXT GEN BY PCR, NASAL: MRSA by PCR Next Gen: NOT DETECTED

## 2021-05-04 SURGERY — FRAMELESS BIOPSY WITH BRAINLAB
Anesthesia: General | Laterality: Left

## 2021-05-04 MED ORDER — CEFAZOLIN SODIUM-DEXTROSE 2-4 GM/100ML-% IV SOLN
2.0000 g | Freq: Once | INTRAVENOUS | Status: AC
  Administered 2021-05-04: 2 g via INTRAVENOUS

## 2021-05-04 MED ORDER — HEPARIN SODIUM (PORCINE) 5000 UNIT/ML IJ SOLN: 5000.0000 [IU] | Freq: Three times a day (TID) | INTRAMUSCULAR | Status: DC

## 2021-05-04 MED ORDER — DOCUSATE SODIUM 100 MG PO CAPS
100.0000 mg | ORAL_CAPSULE | Freq: Two times a day (BID) | ORAL | Status: DC
  Administered 2021-05-05 – 2021-05-06 (×2): 100 mg via ORAL
  Filled 2021-05-04 (×3): qty 1

## 2021-05-04 MED ORDER — SUGAMMADEX SODIUM 200 MG/2ML IV SOLN
INTRAVENOUS | Status: DC | PRN
  Administered 2021-05-04: 200 mg via INTRAVENOUS

## 2021-05-04 MED ORDER — PROMETHAZINE HCL 25 MG PO TABS: 12.5000 mg | ORAL_TABLET | ORAL | Status: DC | PRN

## 2021-05-04 MED ORDER — THROMBIN 20000 UNITS EX SOLR
CUTANEOUS | Status: AC
  Filled 2021-05-04: qty 20000

## 2021-05-04 MED ORDER — BACITRACIN ZINC 500 UNIT/GM EX OINT
TOPICAL_OINTMENT | CUTANEOUS | Status: AC
  Filled 2021-05-04: qty 28.35

## 2021-05-04 MED ORDER — MIDAZOLAM HCL 2 MG/2ML IJ SOLN
INTRAMUSCULAR | Status: AC
  Filled 2021-05-04: qty 2

## 2021-05-04 MED ORDER — DEXAMETHASONE SODIUM PHOSPHATE 10 MG/ML IJ SOLN
INTRAMUSCULAR | Status: AC
  Filled 2021-05-04: qty 1

## 2021-05-04 MED ORDER — SODIUM CHLORIDE 0.9 % IV SOLN: INTRAVENOUS | Status: DC

## 2021-05-04 MED ORDER — CHLORHEXIDINE GLUCONATE 0.12 % MT SOLN
15.0000 mL | Freq: Once | OROMUCOSAL | Status: DC
  Filled 2021-05-04: qty 15

## 2021-05-04 MED ORDER — ACETAMINOPHEN 10 MG/ML IV SOLN: 1000.0000 mg | Freq: Once | INTRAVENOUS | Status: DC | PRN

## 2021-05-04 MED ORDER — ONDANSETRON HCL 4 MG/2ML IJ SOLN
INTRAMUSCULAR | Status: DC | PRN
  Administered 2021-05-04: 4 mg via INTRAVENOUS

## 2021-05-04 MED ORDER — HYDROMORPHONE HCL 1 MG/ML IJ SOLN
0.5000 mg | INTRAMUSCULAR | Status: DC | PRN
  Administered 2021-05-04 – 2021-05-05 (×2): 0.5 mg via INTRAVENOUS
  Filled 2021-05-04 (×2): qty 1

## 2021-05-04 MED ORDER — THROMBIN 5000 UNITS EX SOLR
CUTANEOUS | Status: AC
  Filled 2021-05-04: qty 5000

## 2021-05-04 MED ORDER — HYDROCODONE-ACETAMINOPHEN 5-325 MG PO TABS
1.0000 | ORAL_TABLET | ORAL | Status: DC | PRN
  Administered 2021-05-04 – 2021-05-06 (×4): 1 via ORAL
  Filled 2021-05-04 (×4): qty 1

## 2021-05-04 MED ORDER — PHENYLEPHRINE 40 MCG/ML (10ML) SYRINGE FOR IV PUSH (FOR BLOOD PRESSURE SUPPORT)
PREFILLED_SYRINGE | INTRAVENOUS | Status: DC | PRN
  Administered 2021-05-04: 120 ug via INTRAVENOUS
  Administered 2021-05-04: 80 ug via INTRAVENOUS

## 2021-05-04 MED ORDER — CEFAZOLIN SODIUM-DEXTROSE 2-4 GM/100ML-% IV SOLN
2.0000 g | Freq: Three times a day (TID) | INTRAVENOUS | Status: AC
  Administered 2021-05-05 (×2): 2 g via INTRAVENOUS
  Filled 2021-05-04 (×2): qty 100

## 2021-05-04 MED ORDER — CHLORHEXIDINE GLUCONATE CLOTH 2 % EX PADS
6.0000 | MEDICATED_PAD | Freq: Every day | CUTANEOUS | Status: DC
  Administered 2021-05-04 – 2021-05-05 (×2): 6 via TOPICAL

## 2021-05-04 MED ORDER — FENTANYL CITRATE (PF) 100 MCG/2ML IJ SOLN: 25.0000 ug | INTRAMUSCULAR | Status: DC | PRN

## 2021-05-04 MED ORDER — ACETAMINOPHEN 650 MG RE SUPP: 650.0000 mg | RECTAL | Status: DC | PRN

## 2021-05-04 MED ORDER — LIDOCAINE 2% (20 MG/ML) 5 ML SYRINGE
INTRAMUSCULAR | Status: AC
  Filled 2021-05-04: qty 5

## 2021-05-04 MED ORDER — PHENYLEPHRINE 40 MCG/ML (10ML) SYRINGE FOR IV PUSH (FOR BLOOD PRESSURE SUPPORT)
PREFILLED_SYRINGE | INTRAVENOUS | Status: AC
  Filled 2021-05-04: qty 10

## 2021-05-04 MED ORDER — ORAL CARE MOUTH RINSE: 15.0000 mL | Freq: Once | OROMUCOSAL | Status: AC

## 2021-05-04 MED ORDER — LIDOCAINE 2% (20 MG/ML) 5 ML SYRINGE
INTRAMUSCULAR | Status: DC | PRN
  Administered 2021-05-04: 80 mg via INTRAVENOUS

## 2021-05-04 MED ORDER — LIDOCAINE-EPINEPHRINE 1 %-1:100000 IJ SOLN
INTRAMUSCULAR | Status: DC | PRN
  Administered 2021-05-04: 10 mL

## 2021-05-04 MED ORDER — LABETALOL HCL 5 MG/ML IV SOLN
10.0000 mg | INTRAVENOUS | Status: DC | PRN
  Administered 2021-05-05: 10 mg via INTRAVENOUS
  Filled 2021-05-04 (×2): qty 4

## 2021-05-04 MED ORDER — ONDANSETRON HCL 4 MG/2ML IJ SOLN
INTRAMUSCULAR | Status: AC
  Filled 2021-05-04: qty 2

## 2021-05-04 MED ORDER — FENTANYL CITRATE (PF) 100 MCG/2ML IJ SOLN
INTRAMUSCULAR | Status: DC | PRN
  Administered 2021-05-04: 100 ug via INTRAVENOUS
  Administered 2021-05-04: 50 ug via INTRAVENOUS

## 2021-05-04 MED ORDER — PHENYLEPHRINE HCL-NACL 20-0.9 MG/250ML-% IV SOLN
INTRAVENOUS | Status: DC | PRN
  Administered 2021-05-04: 25 ug/min via INTRAVENOUS

## 2021-05-04 MED ORDER — ONDANSETRON HCL 4 MG/2ML IJ SOLN: 4.0000 mg | INTRAMUSCULAR | Status: DC | PRN

## 2021-05-04 MED ORDER — CHLORHEXIDINE GLUCONATE 0.12 % MT SOLN
15.0000 mL | Freq: Once | OROMUCOSAL | Status: AC
  Administered 2021-05-04: 15 mL via OROMUCOSAL

## 2021-05-04 MED ORDER — PROPOFOL 10 MG/ML IV BOLUS
INTRAVENOUS | Status: AC
  Filled 2021-05-04: qty 20

## 2021-05-04 MED ORDER — LIDOCAINE-EPINEPHRINE 1 %-1:100000 IJ SOLN
INTRAMUSCULAR | Status: AC
  Filled 2021-05-04: qty 1

## 2021-05-04 MED ORDER — ROCURONIUM BROMIDE 10 MG/ML (PF) SYRINGE
PREFILLED_SYRINGE | INTRAVENOUS | Status: AC
  Filled 2021-05-04: qty 10

## 2021-05-04 MED ORDER — ACETAMINOPHEN 325 MG PO TABS: 650.0000 mg | ORAL_TABLET | ORAL | Status: DC | PRN

## 2021-05-04 MED ORDER — FENTANYL CITRATE (PF) 250 MCG/5ML IJ SOLN
INTRAMUSCULAR | Status: AC
  Filled 2021-05-04: qty 5

## 2021-05-04 MED ORDER — ORAL CARE MOUTH RINSE: 15.0000 mL | Freq: Once | OROMUCOSAL | Status: DC

## 2021-05-04 MED ORDER — POLYETHYLENE GLYCOL 3350 17 G PO PACK: 17.0000 g | PACK | Freq: Every day | ORAL | Status: DC | PRN

## 2021-05-04 MED ORDER — ONDANSETRON HCL 4 MG PO TABS: 4.0000 mg | ORAL_TABLET | ORAL | Status: DC | PRN

## 2021-05-04 MED ORDER — CEFAZOLIN SODIUM-DEXTROSE 2-4 GM/100ML-% IV SOLN
INTRAVENOUS | Status: AC
  Filled 2021-05-04: qty 100

## 2021-05-04 MED ORDER — ROCURONIUM BROMIDE 10 MG/ML (PF) SYRINGE
PREFILLED_SYRINGE | INTRAVENOUS | Status: DC | PRN
  Administered 2021-05-04: 30 mg via INTRAVENOUS
  Administered 2021-05-04: 50 mg via INTRAVENOUS

## 2021-05-04 MED ORDER — 0.9 % SODIUM CHLORIDE (POUR BTL) OPTIME
TOPICAL | Status: DC | PRN
  Administered 2021-05-04: 1000 mL

## 2021-05-04 MED ORDER — BACITRACIN ZINC 500 UNIT/GM EX OINT
TOPICAL_OINTMENT | CUTANEOUS | Status: DC | PRN
  Administered 2021-05-04 (×2): 1 via TOPICAL

## 2021-05-04 MED ORDER — PROPOFOL 10 MG/ML IV BOLUS
INTRAVENOUS | Status: DC | PRN
  Administered 2021-05-04: 110 mg via INTRAVENOUS
  Administered 2021-05-04: 50 mg via INTRAVENOUS

## 2021-05-04 SURGICAL SUPPLY — 84 items
BAG COUNTER SPONGE SURGICOUNT (BAG) ×2 IMPLANT
BAND RUBBER #18 3X1/16 STRL (MISCELLANEOUS) IMPLANT
BENZOIN TINCTURE PRP APPL 2/3 (GAUZE/BANDAGES/DRESSINGS) IMPLANT
BLADE CLIPPER SURG (BLADE) ×2 IMPLANT
BLADE SAW GIGLI 16 STRL (MISCELLANEOUS) IMPLANT
BLADE SURG 15 STRL LF DISP TIS (BLADE) IMPLANT
BLADE SURG 15 STRL SS (BLADE)
BNDG GAUZE ELAST 4 BULKY (GAUZE/BANDAGES/DRESSINGS) IMPLANT
BNDG STRETCH 4X75 STRL LF (GAUZE/BANDAGES/DRESSINGS) IMPLANT
BUR ACORN 9.0 PRECISION (BURR) ×2 IMPLANT
BUR ROUND FLUTED 4 SOFT TCH (BURR) IMPLANT
BUR SPIRAL ROUTER 2.3 (BUR) ×2 IMPLANT
CANISTER SUCT 3000ML PPV (MISCELLANEOUS) ×4 IMPLANT
CATH VENTRIC 35X38 W/TROCAR LG (CATHETERS) IMPLANT
CLIP VESOCCLUDE MED 6/CT (CLIP) IMPLANT
CNTNR URN SCR LID CUP LEK RST (MISCELLANEOUS) ×4 IMPLANT
CONT SPEC 4OZ STRL OR WHT (MISCELLANEOUS) ×4
COVER MAYO STAND STRL (DRAPES) IMPLANT
DECANTER SPIKE VIAL GLASS SM (MISCELLANEOUS) IMPLANT
DRAIN SUBARACHNOID (WOUND CARE) IMPLANT
DRAPE HALF SHEET 40X57 (DRAPES) ×2 IMPLANT
DRAPE MICROSCOPE LEICA (MISCELLANEOUS) IMPLANT
DRAPE NEUROLOGICAL W/INCISE (DRAPES) ×2 IMPLANT
DRAPE SHEET LG 3/4 BI-LAMINATE (DRAPES) ×2 IMPLANT
DRAPE STERI IOBAN 125X83 (DRAPES) IMPLANT
DRAPE SURG 17X23 STRL (DRAPES) IMPLANT
DRAPE WARM FLUID 44X44 (DRAPES) IMPLANT
DRSG ADAPTIC 3X8 NADH LF (GAUZE/BANDAGES/DRESSINGS) IMPLANT
DRSG TELFA 3X8 NADH (GAUZE/BANDAGES/DRESSINGS) ×2 IMPLANT
DURAPREP 6ML APPLICATOR 50/CS (WOUND CARE) ×2 IMPLANT
ELECT REM PT RETURN 9FT ADLT (ELECTROSURGICAL) ×2
ELECTRODE REM PT RTRN 9FT ADLT (ELECTROSURGICAL) ×1 IMPLANT
EVACUATOR 1/8 PVC DRAIN (DRAIN) IMPLANT
EVACUATOR SILICONE 100CC (DRAIN) IMPLANT
FORCEPS BIPO MALIS IRRIG 9X1.5 (NEUROSURGERY SUPPLIES) IMPLANT
GAUZE 4X4 16PLY ~~LOC~~+RFID DBL (SPONGE) ×2 IMPLANT
GAUZE SPONGE 4X4 12PLY STRL (GAUZE/BANDAGES/DRESSINGS) IMPLANT
GLOVE EXAM NITRILE LRG STRL (GLOVE) IMPLANT
GLOVE EXAM NITRILE XL STR (GLOVE) IMPLANT
GLOVE EXAM NITRILE XS STR PU (GLOVE) IMPLANT
GLOVE SURG LTX SZ7.5 (GLOVE) ×2 IMPLANT
GLOVE SURG UNDER POLY LF SZ7.5 (GLOVE) ×2 IMPLANT
GOWN STRL REUS W/ TWL LRG LVL3 (GOWN DISPOSABLE) ×2 IMPLANT
GOWN STRL REUS W/ TWL XL LVL3 (GOWN DISPOSABLE) IMPLANT
GOWN STRL REUS W/TWL 2XL LVL3 (GOWN DISPOSABLE) IMPLANT
GOWN STRL REUS W/TWL LRG LVL3 (GOWN DISPOSABLE) ×2
GOWN STRL REUS W/TWL XL LVL3 (GOWN DISPOSABLE)
GRAFT DURAGEN MATRIX 2WX2L ×2 IMPLANT
HEMOSTAT POWDER KIT SURGIFOAM (HEMOSTASIS) IMPLANT
HEMOSTAT SURGICEL 2X14 (HEMOSTASIS) ×2 IMPLANT
HOOK DURA 1/2IN (MISCELLANEOUS) IMPLANT
IV NS 1000ML (IV SOLUTION)
IV NS 1000ML BAXH (IV SOLUTION) IMPLANT
KIT BASIN OR (CUSTOM PROCEDURE TRAY) ×2 IMPLANT
KIT DRAIN CSF ACCUDRAIN (MISCELLANEOUS) IMPLANT
KIT TURNOVER KIT B (KITS) ×2 IMPLANT
NEEDLE HYPO 22GX1.5 SAFETY (NEEDLE) ×2 IMPLANT
NEEDLE SPNL 18GX3.5 QUINCKE PK (NEEDLE) IMPLANT
NS IRRIG 1000ML POUR BTL (IV SOLUTION) ×2 IMPLANT
PACK CRANIOTOMY CUSTOM (CUSTOM PROCEDURE TRAY) ×2 IMPLANT
PATTIES SURGICAL .25X.25 (GAUZE/BANDAGES/DRESSINGS) IMPLANT
PATTIES SURGICAL .5 X.5 (GAUZE/BANDAGES/DRESSINGS) IMPLANT
PATTIES SURGICAL .5 X3 (DISPOSABLE) IMPLANT
PATTIES SURGICAL 1/4 X 3 (GAUZE/BANDAGES/DRESSINGS) IMPLANT
PATTIES SURGICAL 1X1 (DISPOSABLE) IMPLANT
PIN MAYFIELD SKULL DISP (PIN) ×2 IMPLANT
PLATE DOUBLE Y CMF 6H (Plate) ×6 IMPLANT
SCREW UNIII AXS SD 1.5X4 (Screw) ×18 IMPLANT
SPECIMEN JAR SMALL (MISCELLANEOUS) IMPLANT
SPONGE NEURO XRAY DETECT 1X3 (DISPOSABLE) IMPLANT
SPONGE SURGIFOAM ABS GEL 100 (HEMOSTASIS) IMPLANT
STAPLER VISISTAT 35W (STAPLE) ×2 IMPLANT
SUT ETHILON 3 0 FSL (SUTURE) IMPLANT
SUT ETHILON 3 0 PS 1 (SUTURE) IMPLANT
SUT MNCRL AB 3-0 PS2 18 (SUTURE) ×2 IMPLANT
SUT NURALON 4 0 TR CR/8 (SUTURE) ×2 IMPLANT
SUT SILK 0 TIES 10X30 (SUTURE) IMPLANT
SUT VIC AB 2-0 CP2 18 (SUTURE) ×2 IMPLANT
TOWEL GREEN STERILE (TOWEL DISPOSABLE) ×2 IMPLANT
TOWEL GREEN STERILE FF (TOWEL DISPOSABLE) ×2 IMPLANT
TRAY FOLEY MTR SLVR 16FR STAT (SET/KITS/TRAYS/PACK) ×2 IMPLANT
TUBE CONNECTING 12X1/4 (SUCTIONS) IMPLANT
UNDERPAD 30X36 HEAVY ABSORB (UNDERPADS AND DIAPERS) ×2 IMPLANT
WATER STERILE IRR 1000ML POUR (IV SOLUTION) ×2 IMPLANT

## 2021-05-04 NOTE — Plan of Care (Signed)
Pt is alert oriented x 4. Pt denies numbness and tingling. Pts only complaint is that he has blurred vision when looking at objects up close. Pt is now NPO for procedure in the morning.   Problem: Education: Goal: Knowledge of General Education information will improve Description: Including pain rating scale, medication(s)/side effects and non-pharmacologic comfort measures Outcome: Progressing   Problem: Health Behavior/Discharge Planning: Goal: Ability to manage health-related needs will improve Outcome: Progressing   Problem: Clinical Measurements: Goal: Ability to maintain clinical measurements within normal limits will improve Outcome: Progressing Goal: Will remain free from infection Outcome: Progressing Goal: Diagnostic test results will improve Outcome: Progressing Goal: Respiratory complications will improve Outcome: Progressing Goal: Cardiovascular complication will be avoided Outcome: Progressing   Problem: Activity: Goal: Risk for activity intolerance will decrease Outcome: Progressing   Problem: Nutrition: Goal: Adequate nutrition will be maintained Outcome: Progressing   Problem: Coping: Goal: Level of anxiety will decrease Outcome: Progressing   Problem: Elimination: Goal: Will not experience complications related to bowel motility Outcome: Progressing   Problem: Elimination: Goal: Will not experience complications related to bowel motility Outcome: Progressing Goal: Will not experience complications related to urinary retention Outcome: Progressing   Problem: Pain Managment: Goal: General experience of comfort will improve Outcome: Progressing   Problem: Safety: Goal: Ability to remain free from injury will improve Outcome: Progressing   Problem: Skin Integrity: Goal: Risk for impaired skin integrity will decrease Outcome: Progressing   Problem: Education: Goal: Knowledge of disease or condition will improve Outcome: Progressing Goal:  Knowledge of secondary prevention will improve (SELECT ALL) Outcome: Progressing Goal: Knowledge of patient specific risk factors will improve (INDIVIDUALIZE FOR PATIENT) Outcome: Progressing Goal: Individualized Educational Video(s) Outcome: Progressing   Problem: Health Behavior/Discharge Planning: Goal: Ability to manage health-related needs will improve Outcome: Progressing   Problem: Self-Care: Goal: Ability to participate in self-care as condition permits will improve Outcome: Progressing   Problem: Ischemic Stroke/TIA Tissue Perfusion: Goal: Complications of ischemic stroke/TIA will be minimized Outcome: Progressing

## 2021-05-04 NOTE — Progress Notes (Signed)
OT Cancellation Note  Patient Details Name: Weylyn Ricciuti MRN: [id number] DOB: 1975/11/10   Cancelled Treatment:    Reason Eval/Treat Not Completed: Patient at procedure or test/ unavailable (Pt off of floor for brain biopsy, OT treat to f/u as appropriate.)  Latunya Kissick A Jahzeel Poythress 05/04/2021, 4:52 PM

## 2021-05-04 NOTE — Transfer of Care (Signed)
Immediate Anesthesia Transfer of Care Note  Patient: Jeremy Burns  Procedure(s) Performed: LEFT STEROTACTIC OPEN BRAIN BIOPSY WITH BRAINLAB (Left)  Patient Location: PACU  Anesthesia Type:General  Level of Consciousness: awake, alert  and oriented  Airway & Oxygen Therapy: Patient Spontanous Breathing  Post-op Assessment: Report given to RN and Patient moving all extremities X 4  Post vital signs: Reviewed and stable  Last Vitals:  Vitals Value Taken Time  BP 119/89 05/04/21 1830  Temp    Pulse 74 05/04/21 1831  Resp 17 05/04/21 1831  SpO2 96 % 05/04/21 1831  Vitals shown include unvalidated device data.  Last Pain:  Vitals:   05/04/21 1612  TempSrc:   PainSc: 0-No pain      Patients Stated Pain Goal: 0 (76/73/41 [id number])  Complications: No notable events documented.

## 2021-05-04 NOTE — Progress Notes (Signed)
Inpatient Diabetes Program Recommendations  AACE/ADA: New Consensus Statement on Inpatient Glycemic Control (2015)  Target Ranges:  Prepandial:   less than 140 mg/dL      Peak postprandial:   less than 180 mg/dL (1-2 hours)      Critically ill patients:  140 - 180 mg/dL   Lab Results  Component Value Date   GLUCAP 351 (H) 05/04/2021   HGBA1C >15.5 (H) 04/26/2021    Review of Glycemic Control Results for QUENTAVIOUS, [facility]EN[facility]SE (MRN [id number]) as of 05/04/2021 09:14  Ref. Range 05/03/2021 16:35 05/03/2021 21:26 05/04/2021 06:38  Glucose-Capillary Latest Ref Range: 70 - 99 mg/dL 186 (H) 274 (H) 351 (H)   Diabetes history: DM 2 Outpatient Diabetes medications:  Novolin R 10 units tid with meals Levemir 55 units bid Metformin XR- 500 mg bid Current orders for Inpatient glycemic control:  Novolog moderate tid with meals and HS Semglee 45 units daily   Inpatient Diabetes Program Recommendations:    With patient remaining NPO today, consider increasing Semglee to 55 units and changing correction to Q4H during monitoring in ICU.   Thanks, Bronson Curb, MSN, RNC-OB Diabetes Coordinator [phone] (8a-5p)

## 2021-05-04 NOTE — Progress Notes (Signed)
Neurosurgery Service Post-operative progress note  Assessment & Plan: 45 y.o. man s/p L craniotomy and brain biopsy, seen in PACU, awake/alert, Fcx4, speech fluent with intact repetition / normal content.  -admit to 4N ICU overnight, if doing well can transfer back out of the unit tomorrow -CTH tonight to confirm biopsy location for future reference if path is negative -activity / diet as tolerated -can restart ASA POD3 if CTH shows no hemorrhage, SQH POD2  Judith Part  05/04/21 6:22 PM

## 2021-05-04 NOTE — Progress Notes (Signed)
[facility] for Infectious Disease  Date of Admission:  04/26/2021     Jeremy Burns is a 45 y.o. 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   Workup has included malignancy , infection, and autoimmune causes.  HIV negative, CSF negative for cryptococcal antigen and cultures unrevealing from CSF. ACE levels returned normal. Toxoplasma antibodies negative. Histoplasma negative.   CT abdomen pelvis on 10/28 demonstrated focal luminal narrowing and wall thickening with the ascending colon near the hepatic flexor. Followed up with colonoscopy on 10/31, path reports show bowel ischemia and tubular adenoma.   Brain biopsy on 11/2 - speciments sent.   ASSESSMENT: Multiple enhancing parenchymal and leptomeningeal lesion in left frontoparietal insular area New onset seizures colonic ulceration and polys Chronic combined Systolic and Diastolic CHF with AICD CKD Stage 1 Uncontrolled Type 2 Diabetes Mellitus HTN   PLAN: F/u surgical path report Submitted order for PCR testing , results to go to [facility] in process.   Active Problems:   Seizure ([place])   Noninfectious gastroenteritis   History of colonic polyps   Brain metastasis ([place])   Brain lesion     stroke: mapping our early stages of recovery book   Does not apply Once   amiodarone  200 mg Oral Daily   aspirin EC  81 mg Oral Daily   atorvastatin  80 mg Oral Daily   carvedilol  25 mg Oral BID WC   chlorhexidine gluconate (MEDLINE KIT)  15 mL Mouth Rinse BID   insulin aspart  0-15 Units Subcutaneous TID WC   insulin aspart  0-5 Units Subcutaneous QHS   insulin glargine-yfgn  45 Units Subcutaneous Daily   levETIRAcetam  1,500 mg Oral BID   mouth rinse  15 mL Mouth Rinse 10 times per day    SUBJECTIVE: No new complaints.    OBJECTIVE: Vitals:   05/03/21 1947 05/03/21 2336  05/04/21 0312 05/04/21 0759  BP: 109/77 (!) 97/51 110/72 121/73  Pulse: 79 70 72 70  Resp: _0 Temp: 98.5 F (36.9 C) 98.2 F (36.8 C) (!) 97.5 F (36.4 C) 97.6 F (36.4 C)  TempSrc: Oral Oral Axillary Oral  SpO2: 100% 98% 96% 100%  Weight:      Height:       Body mass index is 31.03 kg/m.  Physical Exam Constitutional:      Appearance: Normal appearance.  HENT:     Head: Normocephalic and atraumatic.  Pulmonary:     Effort: Pulmonary effort is normal. No respiratory distress.  Skin:    General: Skin is warm and dry.  Neurological:     Mental Status: He is alert and oriented to person, place, and time. Mental status is at baseline.  Psychiatric:        Mood and Affect: Mood normal.        Behavior: Behavior normal.        Thought Content: Thought content normal.    Lab Results Lab Results  Component Value Date   WBC 6.2 05/04/2021   HGB 12.9 (L) 05/04/2021   HCT 40.4 05/04/2021   MCV 92.7 05/04/2021   PLT 244 05/04/2021    Lab Results  Component Value Date   CREATININE 1.22 05/04/2021   BUN 9 05/04/2021   NA 134 (L) 05/04/2021   K 4.1 05/04/2021  CL 102 05/04/2021   CO2 23 05/04/2021    Lab Results  Component Value Date   ALT 15 05/01/2021   AST 14 (L) 05/01/2021   ALKPHOS 67 05/01/2021   BILITOT 0.4 05/01/2021     Microbiology: Recent Results (from the past 240 hour(s))  VZV PCR, CSF     Status: None   Collection Time: 04/29/21 10:29 AM   Specimen: Cerebrospinal Fluid  Result Value Ref Range Status   VZV PCR, CSF Negative Negative Final    Comment: (NOTE) No Varicella Zoster Virus DNA detected. Performed At: [facility] [place], [place] [id number] Rush Farmer MD YG:[id number]   CSF culture w Gram Stain     Status: None   Collection Time: 04/29/21 10:29 AM   Specimen: CSF  Result Value Ref Range Status   Specimen Description CSF  Final   Special Requests NONE  Final   Gram Stain   Final    WBC  PRESENT, PREDOMINANTLY MONONUCLEAR NO ORGANISMS SEEN CYTOSPIN SMEAR    Culture   Final    NO GROWTH 3 DAYS Performed at [facility], [address], [place], [place] [address]    Report Status 05/02/2021 FINAL  Final    Tamsen Snider, MD PGY3 Internal Medicine [phone]

## 2021-05-04 NOTE — Progress Notes (Signed)
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  Reportedly, he was found down by EMS and en route to the hospital, he had a seizure.  He also had another seizure in the emergency room.  He was found to severe hyperglycemia with glucose level greater than 600, ascending colon wall thickening on CT abdomen and pelvis and multiple enhancing parenchymal and leptomeningeal lesions in the brain.      Assessment/Plan:   Active Problems:   Seizure ([place])   Noninfectious gastroenteritis   History of colonic polyps   Brain metastasis ([facility])   Brain lesion   Body mass index is 31.03 kg/m.  (Obesity)  New onset seizures, multiple enhancing brain parenchymal and leptomeningeal lesions: EEG showed epileptiform activity in the left frontal anterior temporal region.  Continue Keppra. Plan for brain biopsy today. ACE test, HIV test, toxoplasma antibodies and CSF VDRL, HSV and VZV tests were normal.  CSF cytology negative for malignancy JC virus, CSF VDRL, QuantiFERON-TB gold are pending.   Type II DM with severe hyperglycemia: Hemoglobin A1c > 15.5.  Continue insulin glargine and NovoLog.  He is n.p.o. for procedure so we will be cautious with increasing insulin.   CAD, chronic systolic and diastolic CHF s/p AICD: Compensated.   Chronic ulcers and polyps noted on colonoscopy: Follow-up pathology report   Right lung nodule: Outpatient follow-up with pulmonologist    Diet Order             Diet NPO time specified Except for: Sips with Meds  Diet effective now                       Consultants: Infectious disease Neurosurgeon Gastroenterologist Neurologist  Procedures: Brain biopsy  scheduled on 05/04/2021    Medications:     stroke: mapping our early stages of recovery book   Does not apply Once   amiodarone  200 mg Oral Daily   aspirin EC  81 mg Oral Daily   atorvastatin  80 mg Oral Daily   carvedilol  25 mg Oral BID WC   chlorhexidine gluconate (MEDLINE KIT)  15 mL Mouth Rinse BID   insulin aspart  0-15 Units Subcutaneous TID WC   insulin aspart  0-5 Units Subcutaneous QHS   insulin glargine-yfgn  45 Units Subcutaneous Daily   levETIRAcetam  1,500 mg Oral BID   mouth rinse  15 mL Mouth Rinse 10 times per day   Continuous Infusions:  sodium chloride 10 mL/hr at 05/01/21 1347     Anti-infectives (From admission, onward)    None              Family Communication/Anticipated D/C date and plan/Code Status   DVT prophylaxis:      Code Status: Full Code  Family Communication: None Disposition Plan: Home when cleared by neurosurgeon   Status is: Inpatient  Remains inpatient appropriate because: Brain lesions undergoing work up           Subjective:   C/o blurry vision. No headache, dizziness or confusion  Objective:  Vitals:   05/03/21 2336 05/04/21 0312 05/04/21 0759 05/04/21 1157  BP: (!) 97/51 110/72 121/73 95/70  Pulse: 70 72 70 64  Resp: '14 20 14 14  ' Temp: 98.2 F (36.8 C) (!) 97.5 F (36.4 C) 97.6 F (36.4 C) 97.9 F (36.6 C)  TempSrc: Oral Axillary Oral Oral  SpO2: 98% 96% 100% 99%  Weight:      Height:       No data found.   Intake/Output Summary (Last 24 hours) at 05/04/2021 1216 Last data filed at 05/04/2021 0500 Gross per 24 hour  Intake 720 ml  Output --  Net 720 ml   Filed Weights   04/26/21 1700 04/27/21 1513 05/02/21 0844  Weight: 42.6 kg 98.1 kg 98.1 kg    Exam:  GEN: NAD SKIN: No rash EYES: EOMI ENT: MMM CV: RRR PULM: CTA B ABD: soft, ND, NT, +BS CNS: AAO x  3, non focal EXT: No edema or tenderness        Data Reviewed:   I have personally reviewed following labs and imaging studies:  Labs: Labs show the following:   Basic Metabolic Panel: Recent Labs  Lab 04/28/21 0245 04/30/21 0618 05/01/21 0319 05/04/21 0140  NA 133* 134* 136 134*  K 3.4* 4.1 3.9 4.1  CL 105 101 104 102  CO2 '22 23 25 23  ' GLUCOSE 283* 187* 240* 356*  BUN '11 9 15 9  ' CREATININE 0.98 0.95 1.02 1.22  CALCIUM 8.5* 9.0 8.9 8.7*   GFR Estimated Creatinine Clearance: 90.7 mL/min (by C-G formula based on SCr of 1.22 mg/dL). Liver Function Tests: Recent Labs  Lab 05/01/21 0319  AST 14*  ALT 15  ALKPHOS 67  BILITOT 0.4  PROT 6.0*  ALBUMIN 2.9*   No results for input(s): LIPASE, AMYLASE in the last 168 hours. No results for input(s): AMMONIA in the last 168 hours. Coagulation profile Recent Labs  Lab 04/28/21 1025  INR 1.0    CBC: Recent Labs  Lab 04/28/21 0245 04/30/21 0618 05/01/21 0319 05/04/21 0140  WBC 7.4 7.2 7.1 6.2  HGB 14.0 14.5 14.0 12.9*  HCT 43.0 44.3 43.5 40.4  MCV 91.1 90.8 91.2 92.7  PLT 239 251 252 244   Cardiac Enzymes: No results for input(s): CKTOTAL, CKMB, CKMBINDEX, TROPONINI in the last 168 hours. BNP (last 3 results) No results for input(s): PROBNP in the last 8760 hours. CBG: Recent Labs  Lab 05/03/21 1205 05/03/21 1635 05/03/21 2126 05/04/21 0638 05/04/21 1200  GLUCAP 175* 186* 274* 351* 139*   D-Dimer: No results for input(s): DDIMER in the last 72 hours. Hgb A1c: No results for input(s): HGBA1C in the last 72 hours. Lipid Profile: No results for input(s): CHOL, HDL, LDLCALC, TRIG, CHOLHDL, LDLDIRECT in the last 72 hours. Thyroid function studies: No results for input(s): TSH, T4TOTAL, T3FREE, THYROIDAB in the last 72 hours.  Invalid input(s): FREET3 Anemia work up: No results for input(s): VITAMINB12, FOLATE, FERRITIN, TIBC, IRON, RETICCTPCT in the last 72 hours. Sepsis Labs: Recent Labs  Lab  04/28/21 0245 04/30/21 0618 05/01/21 0319 05/04/21 0140  WBC 7.4 7.2 7.1 6.2    Microbiology Recent Results (from the past 240 hour(s))  VZV PCR, CSF     Status: None   Collection Time: 04/29/21 10:29 AM   Specimen: Cerebrospinal Fluid  Result Value Ref Range Status   VZV PCR, CSF Negative Negative Final    Comment: (NOTE) No Varicella Zoster Virus DNA detected. Performed At: [facility] [employer] [address]  [place], [place] [id number] Rush Farmer MD IZ:[id number]   CSF culture w Gram Stain     Status: None   Collection Time: 04/29/21 10:29 AM   Specimen: CSF  Result Value Ref Range Status   Specimen Description CSF  Final   Special Requests NONE  Final   Gram Stain   Final    WBC PRESENT, PREDOMINANTLY MONONUCLEAR NO ORGANISMS SEEN CYTOSPIN SMEAR    Culture   Final    NO GROWTH 3 DAYS Performed at [facility], [place] [address] [place], [place] [address]    Report Status 05/02/2021 FINAL  Final  SARS Coronavirus 2 by RT PCR (hospital order, performed in [facility] hospital lab) Nasopharyngeal Nasopharyngeal Swab     Status: None   Collection Time: 05/04/21  7:45 AM   Specimen: Nasopharyngeal Swab  Result Value Ref Range Status   SARS Coronavirus 2 NEGATIVE NEGATIVE Final    Comment: (NOTE) SARS-CoV-2 target nucleic acids are NOT DETECTED.  The SARS-CoV-2 RNA is generally detectable in upper and lower respiratory specimens during the acute phase of infection. The lowest concentration of SARS-CoV-2 viral copies this assay can detect is 250 copies / mL. A negative result does not preclude SARS-CoV-2 infection and should not be used as the sole basis for treatment or other patient management decisions.  A negative result may occur with improper specimen collection / handling, submission of specimen other than nasopharyngeal swab, presence of viral mutation(s) within the areas targeted by this assay, and inadequate number of viral copies (<250 copies /  mL). A negative result must be combined with clinical observations, patient history, and epidemiological information.  Fact Sheet for Patients:   StrictlyIdeas.no  Fact Sheet for Healthcare Providers: BankingDealers.co.za  This test is not yet approved or  cleared by the [place] FDA and has been authorized for detection and/or diagnosis of SARS-CoV-2 by FDA under an Emergency Use Authorization (EUA).  This EUA will remain in effect (meaning this test can be used) for the duration of the COVID-19 declaration under Section 564(b)(1) of the Act, 21 U.S.C. section 360bbb-3(b)(1), unless the authorization is terminated or revoked sooner.  Performed at [facility], [place] [address], [place], [place] [address]     Procedures and diagnostic studies:  CT BRAINLAB HEAD W/O CONTRAST (1MM)  Result Date: 05/03/2021 CLINICAL DATA:  45 year old male with seizure. Abnormal parenchymal and/or leptomeningeal enhancement in the posterior left insula, posterior left frontal and parietal lobes on recent MRI. Stereotactic surgical planning. EXAM: CT HEAD WITHOUT CONTRAST TECHNIQUE: Contiguous axial images were obtained from the base of the skull through the vertex without intravenous contrast. COMPARISON:  Brain MRI 04/27/2021.  Head CT 04/26/2021. FINDINGS: Brain: Vague asymmetric cortical hypodensity at the left parietal lobe on series 5, image 22 peers to correspond to the area of abnormal enhancement by MRI. This is on series 3, image 106 and 109. See also sagittal series 7, image 91. The subtle hypodensity at the posterior left insula by CT is less apparent but probably on series 3, image 85. No cerebral vasogenic edema or mass effect. No midline shift or ventriculomegaly. No acute intracranial hemorrhage identified. No new areas of abnormal gray-white matter differentiation. Vascular: Mild Calcified atherosclerosis at the skull base. No suspicious  intracranial vascular hyperdensity. Skull: Negative; small benign right anterior frontal bone exostosis on series 4, image 65. Sinuses/Orbits: Visualized paranasal sinuses and mastoids are stable and well aerated. Other: Visualized orbits and scalp soft tissues are within normal  limits. IMPRESSION: 1. Study for stereotactic surgical planning. Abnormal left hemisphere cortical areas by MRI are visible on series 3, image 109 (left parietal lobe) and 85 (posterior insula). 2. No associated vasogenic edema or mass effect. No new intracranial abnormality identified. Electronically Signed   By: Genevie Ann M.D.   On: 05/03/2021 08:29               LOS: 8 days   Trayce Maino  [facility]   Pager on [email or web address]. If 7PM-7AM, please contact night-coverage at [email or web address]     05/04/2021, 12:16 PM

## 2021-05-04 NOTE — Progress Notes (Signed)
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 MRI revealed multiple enhancing parenchymal and leptomeningeal lesion in the left  frontoparietal and insular region. PMHx: none on file    PT Comments    Pt pleasant and agreeable to physical therapy. Overall pt is functioning at an independent level and has met acute PT goals. Overall tolerance for functional activity is likely lower than his baseline however anticipate pt will progress quickly. Today, pt able to perform ~500' ambulation without difficulty or evidence of unsteadiness. Pt scheduled for a brain biopsy this afternoon. Will keep on acute PT caseload to assess after surgery to ensure no further needs prior to signing off.    Recommendations for follow up therapy are one component of a multi-disciplinary discharge planning process, led by the attending physician.  Recommendations may be updated based on patient status, additional functional criteria and insurance authorization.  Follow Up Recommendations  No PT follow up     Assistance Recommended at Discharge PRN  Equipment Recommendations  None recommended by PT    Recommendations for Other Services       Precautions / Restrictions Precautions Precautions: Fall Precaution Comments: seizure Restrictions Weight Bearing Restrictions: No     Mobility  Bed Mobility Overal bed mobility: Independent             General bed mobility comments: No assist required. HOB flat and no use of rails.    Transfers Overall transfer level: Independent Equipment used: None Transfers: Sit to/from Stand              General transfer comment: No assist to power-up to full stand. Good control to lower back down to EOB at end of session. Min use of UE's for support.    Ambulation/Gait Ambulation/Gait assistance: Modified independent (Device/Increase time) Gait Distance (Feet): 500 Feet Assistive device: None Gait Pattern/deviations: WFL(Within Functional Limits) Gait velocity: Decreased Gait velocity interpretation: >2.62 ft/sec, indicative of community ambulatory General Gait Details: Pt able to ambulate around hallway without difficulty. No unsteadiness or LOB noted, and pt talking throughout gait training. Pt with good direction finding and motivated for distance.   Stairs             Wheelchair Mobility    Modified Rankin (Stroke Patients Only)       Balance Overall balance assessment: Needs assistance Sitting-balance support: Feet supported Sitting balance-Leahy Scale: Normal     Standing balance support: No upper extremity supported Standing balance-Leahy Scale: Good Standing balance comment: supervision while standing due to history of seizures                            Cognition Arousal/Alertness: Awake/alert Behavior During Therapy: Impulsive Overall Cognitive Status: Impaired/Different from baseline Area of Impairment: Attention;Safety/judgement                   Current Attention Level: Alternating   Following Commands: Follows multi-step commands consistently Safety/Judgement: Decreased awareness of safety;Decreased awareness of deficits   Problem Solving: Requires verbal cues General Comments: decreased impulsiveness        Exercises      General Comments  Pertinent Vitals/Pain Pain Assessment: No/denies pain    Home Living                          Prior Function            PT Goals (current goals can now be found in the care plan section) Acute Rehab PT Goals Patient Stated Goal: Be able to get  home PT Goal Formulation: With patient Time For Goal Achievement: 05/11/21 Potential to Achieve Goals: Good Progress towards PT goals: Goals met and updated - see care plan    Frequency    Min 3X/week      PT Plan Current plan remains appropriate    Co-evaluation              AM-PAC PT "6 Clicks" Mobility   Outcome Measure  Help needed turning from your back to your side while in a flat bed without using bedrails?: None Help needed moving from lying on your back to sitting on the side of a flat bed without using bedrails?: None Help needed moving to and from a bed to a chair (including a wheelchair)?: None Help needed standing up from a chair using your arms (e.g., wheelchair or bedside chair)?: None Help needed to walk in hospital room?: None Help needed climbing 3-5 steps with a railing? : None 6 Click Score: 24    End of Session   Activity Tolerance: Patient tolerated treatment well Patient left: in bed;with call bell/phone within reach Nurse Communication: Mobility status PT Visit Diagnosis: Other symptoms and signs involving the nervous system (M93.112)     Time: 1624-4695 PT Time Calculation (min) (ACUTE ONLY): 18 min  Charges:  $Gait Training: 8-22 mins                     Rolinda Roan, PT, DPT Acute Rehabilitation Services Pager: [phone] Office: [phone]    Thelma Comp 05/04/2021, 1:42 PM

## 2021-05-04 NOTE — Plan of Care (Signed)
?  Problem: Education: ?Goal: Knowledge of General Education information will improve ?Description: Including pain rating scale, medication(s)/side effects and non-pharmacologic comfort measures ?Outcome: Progressing ?  ?Problem: Health Behavior/Discharge Planning: ?Goal: Ability to manage health-related needs will improve ?Outcome: Progressing ?  ?Problem: Clinical Measurements: ?Goal: Ability to maintain clinical measurements within normal limits will improve ?Outcome: Progressing ?Goal: Will remain free from infection ?Outcome: Progressing ?Goal: Diagnostic test results will improve ?Outcome: Progressing ?Goal: Respiratory complications will improve ?Outcome: Progressing ?Goal: Cardiovascular complication will be avoided ?Outcome: Progressing ?  ?Problem: Activity: ?Goal: Risk for activity intolerance will decrease ?Outcome: Progressing ?  ?Problem: Nutrition: ?Goal: Adequate nutrition will be maintained ?Outcome: Progressing ?  ?Problem: Coping: ?Goal: Level of anxiety will decrease ?Outcome: Progressing ?  ?Problem: Elimination: ?Goal: Will not experience complications related to bowel motility ?Outcome: Progressing ?Goal: Will not experience complications related to urinary retention ?Outcome: Progressing ?  ?Problem: Pain Managment: ?Goal: General experience of comfort will improve ?Outcome: Progressing ?  ?Problem: Safety: ?Goal: Ability to remain free from injury will improve ?Outcome: Progressing ?  ?Problem: Skin Integrity: ?Goal: Risk for impaired skin integrity will decrease ?Outcome: Progressing ?  ?Problem: Education: ?Goal: Knowledge of disease or condition will improve ?Outcome: Progressing ?Goal: Knowledge of secondary prevention will improve (SELECT ALL) ?Outcome: Progressing ?Goal: Knowledge of patient specific risk factors will improve (INDIVIDUALIZE FOR PATIENT) ?Outcome: Progressing ?Goal: Individualized Educational Video(s) ?Outcome: Progressing ?  ?Problem: Health Behavior/Discharge  Planning: ?Goal: Ability to manage health-related needs will improve ?Outcome: Progressing ?  ?Problem: Self-Care: ?Goal: Ability to participate in self-care as condition permits will improve ?Outcome: Progressing ?  ?Problem: Ischemic Stroke/TIA Tissue Perfusion: ?Goal: Complications of ischemic stroke/TIA will be minimized ?Outcome: Progressing ?  ?

## 2021-05-04 NOTE — Anesthesia Procedure Notes (Signed)
Procedure Name: Intubation Date/Time: 05/04/2021 5:00 PM Performed by: Barrington Ellison, CRNA Pre-anesthesia Checklist: Patient identified, Emergency Drugs available, Suction available and Patient being monitored Patient Re-evaluated:Patient Re-evaluated prior to induction Oxygen Delivery Method: Circle System Utilized Preoxygenation: Pre-oxygenation with 100% oxygen Induction Type: IV induction Ventilation: Mask ventilation without difficulty Laryngoscope Size: Mac and 4 Grade View: Grade I Tube type: Oral Tube size: 7.5 mm Number of attempts: 1 Airway Equipment and Method: Stylet and Oral airway Placement Confirmation: ETT inserted through vocal cords under direct vision, positive ETCO2 and breath sounds checked- equal and bilateral Secured at: 22 cm Tube secured with: Tape Dental Injury: Teeth and Oropharynx as per pre-operative assessment

## 2021-05-04 NOTE — Progress Notes (Signed)
Neurosurgery Service Progress Note  Subjective: No acute events overnight, no new complaints  Objective: Vitals:   05/03/21 2336 05/04/21 0312 05/04/21 0759 05/04/21 1157  BP: (!) 97/51 110/72 121/73 95/70  Pulse: 70 72 70 64  Resp: 14 20 14 14   Temp: 98.2 F (36.8 C) (!) 97.5 F (36.4 C) 97.6 F (36.4 C) 97.9 F (36.6 C)  TempSrc: Oral Axillary Oral Oral  SpO2: 98% 96% 100% 99%  Weight:      Height:        Physical Exam: AOx3, PERRL, EOMI, FS, TM, Strength 5/5 x4, SILTx4, no drift  Assessment & Plan: 45 y.o.  man with new onset seizures, MRI with multifocal left hemipsheric enhancement with minimal edema.  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

## 2021-05-04 NOTE — Anesthesia Preprocedure Evaluation (Addendum)
Anesthesia Evaluation  Patient identified by MRN, date of birth, ID band Patient awake    Reviewed: Allergy & Precautions, NPO status , Patient's Chart, lab work & pertinent test results  Airway Mallampati: II  TM Distance: >3 FB Neck ROM: Full    Dental  (+) Edentulous Upper, Edentulous Lower   Pulmonary neg pulmonary ROS, former smoker,    Pulmonary exam normal        Cardiovascular hypertension, Pt. on medications and Pt. on home beta blockers +CHF  + pacemaker + Cardiac Defibrillator  Rhythm:Regular Rate:Normal     Neuro/Psych Seizures -, Well Controlled,  negative psych ROS   GI/Hepatic negative GI ROS, Neg liver ROS,   Endo/Other  diabetes, Poorly Controlled, Type 2, Oral Hypoglycemic Agents, Insulin DependentA1C 14  Renal/GU CRFRenal disease  negative genitourinary   Musculoskeletal negative musculoskeletal ROS (+)   Abdominal (+)  Abdomen: soft.    Peds  Hematology negative hematology ROS (+)   Anesthesia Other Findings   Reproductive/Obstetrics                             Anesthesia Physical Anesthesia Plan  ASA: 3  Anesthesia Plan: General   Post-op Pain Management:    Induction: Intravenous  PONV Risk Score and Plan: 2 and Ondansetron, Midazolam and Treatment may vary due to age or medical condition  Airway Management Planned: Mask and Oral ETT  Additional Equipment: Arterial line  Intra-op Plan:   Post-operative Plan: Extubation in OR  Informed Consent: I have reviewed the patients History and Physical, chart, labs and discussed the procedure including the risks, benefits and alternatives for the proposed anesthesia with the patient or authorized representative who has indicated his/her understanding and acceptance.     Dental advisory given  Plan Discussed with: CRNA  Anesthesia Plan Comments: (ECHO 04/27/21: 1. Left ventricular ejection fraction, by  estimation, is <20%. The left  ventricle has severely decreased function. The left ventricle demonstrates  global hypokinesis. The left ventricular internal cavity size was severely  dilated. Left ventricular  diastolic parameters are consistent with Grade III diastolic dysfunction  (restrictive). No LV thrombus was noted.  2. Right ventricular systolic function is mildly reduced. The right  ventricular size is normal. Tricuspid regurgitation signal is inadequate  for assessing PA pressure.  3. Left atrial size was moderately dilated.  4. The mitral valve is normal in structure. Moderate mitral valve  regurgitation, likely functional due to dilated annulus. No evidence of  mitral stenosis.  5. The aortic valve is tricuspid. Aortic valve regurgitation is not  visualized. No aortic stenosis is present.  6. The inferior vena cava is dilated in size with >50% respiratory  variability, suggesting right atrial pressure of 8 mmHg.  Lab Results      Component                Value               Date                      WBC                      6.2                 05/04/2021                HGB  12.9 (L)            05/04/2021                HCT                      40.4                05/04/2021                MCV                      92.7                05/04/2021                PLT                      244                 05/04/2021           Lab Results      Component                Value               Date                      NA                       134 (L)             05/04/2021                K                        4.1                 05/04/2021                CO2                      23                  05/04/2021                GLUCOSE                  356 (H)             05/04/2021                BUN                      9                   05/04/2021                CREATININE               1.22                05/04/2021                CALCIUM                   8.7 (L)             05/04/2021  GFRNONAA                 >60                 05/04/2021          )        Anesthesia Quick Evaluation

## 2021-05-04 NOTE — Op Note (Signed)
PATIENT: Jeremy Burns  DAY OF SURGERY: 05/04/21   PRE-OPERATIVE DIAGNOSIS:  Brain mass   POST-OPERATIVE DIAGNOSIS:  Same   PROCEDURE:  Left craniotomy for open tumor and leptomeningeal biopsy, use of frameless stereotaxy   SURGEON:  Surgeon(s) and Role:    Judith Part, MD - Primary   ANESTHESIA: ETGA   BRIEF HISTORY: This is a 45 year old man who presented with seizures. MRI showed left hemispheric patchy cortical enhancement, further workup was negative. I therefore recommended an open brain biopsy with leptomeningeal biopsy as well. I discussed this at length with the patient as well as risks, benefits, and alternatives. We also discussed that the best location to biopsy was in eloquent tissue and he may have a post-operative deficit from such a biopsy, but he needs a diagnosis. We discussed this at length and he wished to proceed with surgery.   OPERATIVE DETAIL: The patient was taken to the operating room and placed on the OR table in the supine position. A formal time out was performed with two patient identifiers and confirmed the operative site. Anesthesia was induced by the anesthesia team. The [place] head holder was applied to the head and a registration array was attached to the [place]. This was co-registered with the patient's preoperative imaging, the fit appeared to be acceptable. Using frameless stereotaxy, the operative trajectory was planned and the incision was marked. The incision was planned to span the focal area of enhancement over the left parietal cortex. Hair was clipped with surgical clippers over the incision and the area was then prepped and draped in a sterile fashion.  A linear incision was placed in the left superior parietal region. Soft tissues were dissected and retracted. A high speed drill was used to create a burr hole then turn a small craniotomy over the biopsy site. The dura was incised sharply without cautery and resected then sent to  pathology.   The underlying brain appeared grossly abnormal. There was a large region of erythematous, swollen brain that looked like it had some subarachnoid infiltration or hemorrhage. A cubic biopsy specimen was taken using a #11 blade measuring roughly one cubic centimeter. This was sent for path. Separate samples were requested for fungal / micro labs, so I used microbiopsy forceps to obtain small specimens for them as well. Hemostasis was obtained, the wound was copiously irrigated, the bone flap was plated with titanium plates and screws, duragen was placed over the dural defect, the bone flap was secured in place with titanium screws, all instrument and sponge counts were correct, the incision was then closed in layers. The patient was then returned to anesthesia for emergence. No apparent complications at the completion of the procedure.   EBL:  36mL   DRAINS: none   SPECIMENS: Left parietal dura, left parietal brain biopsy, left brain biopsy for aerobe/anaerobe / gram stain / fungal Cx / AFB.   Judith Part, MD 05/04/21 4:32 PM

## 2021-05-05 ENCOUNTER — Encounter (HOSPITAL_COMMUNITY): Payer: Self-pay | Admitting: Neurological Surgery

## 2021-05-05 ENCOUNTER — Encounter: Payer: Self-pay | Admitting: Internal Medicine

## 2021-05-05 DIAGNOSIS — G939 Disorder of brain, unspecified: Secondary | ICD-10-CM | POA: Diagnosis not present

## 2021-05-05 DIAGNOSIS — Z86010 Personal history of colonic polyps: Secondary | ICD-10-CM | POA: Diagnosis not present

## 2021-05-05 DIAGNOSIS — R569 Unspecified convulsions: Secondary | ICD-10-CM | POA: Diagnosis not present

## 2021-05-05 DIAGNOSIS — C7931 Secondary malignant neoplasm of brain: Secondary | ICD-10-CM | POA: Diagnosis not present

## 2021-05-05 LAB — CSF IGG: IgG, CSF: 11.2 mg/dL — ABNORMAL HIGH (ref 0.0–10.3)

## 2021-05-05 LAB — GLUCOSE, CAPILLARY
Glucose-Capillary: 284 mg/dL — ABNORMAL HIGH (ref 70–99)
Glucose-Capillary: 255 mg/dL — ABNORMAL HIGH (ref 70–99)
Glucose-Capillary: 283 mg/dL — ABNORMAL HIGH (ref 70–99)
Glucose-Capillary: 269 mg/dL — ABNORMAL HIGH (ref 70–99)

## 2021-05-05 LAB — OLIGOCLONAL BANDS, CSF + SERM

## 2021-05-05 LAB — MYELIN BASIC PROTEIN, CSF: Myelin Basic Protein: 4.8 ng/mL — ABNORMAL HIGH (ref 0.0–4.7)

## 2021-05-05 NOTE — Progress Notes (Signed)
Subjective:  He has some mild tenderness at the surgical incision site.   Antibiotics:  Anti-infectives (From admission, onward)    Start     Dose/Rate Route Frequency Ordered Stop   05/04/21 2300  ceFAZolin (ANCEF) IVPB 2g/100 mL premix        2 g 200 mL/hr over 30 Minutes Intravenous Every 8 hours 05/04/21 2026 05/05/21 0642   05/04/21 1603  ceFAZolin (ANCEF) 2-4 GM/100ML-% IVPB       Note to Pharmacy: Block, Sarah   : cabinet override      05/04/21 1603 05/04/21 1721   05/04/21 1600  ceFAZolin (ANCEF) IVPB 2g/100 mL premix        2 g 200 mL/hr over 30 Minutes Intravenous  Once 05/04/21 1559 05/04/21 1705       Medications: Scheduled Meds:   stroke: mapping our early stages of recovery book   Does not apply Once   amiodarone  200 mg Oral Daily   aspirin EC  81 mg Oral Daily   atorvastatin  80 mg Oral Daily   carvedilol  25 mg Oral BID WC   Chlorhexidine Gluconate Cloth  6 each Topical Daily   docusate sodium  100 mg Oral BID   [START ON 05/06/2021] heparin injection (subcutaneous)  5,000 Units Subcutaneous Q8H   insulin aspart  0-15 Units Subcutaneous TID WC   insulin aspart  0-5 Units Subcutaneous QHS   insulin glargine-yfgn  45 Units Subcutaneous Daily   levETIRAcetam  1,500 mg Oral BID   Continuous Infusions:  sodium chloride 10 mL/hr at 05/05/21 0600   PRN Meds:.acetaminophen **OR** acetaminophen, HYDROcodone-acetaminophen, HYDROmorphone (DILAUDID) injection, influenza vac split quadrivalent PF, labetalol, LORazepam, neomycin-bacitracin-polymyxin, ondansetron **OR** ondansetron (ZOFRAN) IV, polyethylene glycol, promethazine    Objective: Weight change:   Intake/Output Summary (Last 24 hours) at 05/05/2021 1808 Last data filed at 05/05/2021 1600 Gross per 24 hour  Intake 771.8 ml  Output 2235 ml  Net -1463.2 ml   Blood pressure (!) 142/88, pulse 90, temperature 98.7 F (37.1 C), temperature source Axillary, resp. rate 17, height 5\' 10"  (1.778 m),  weight 98.1 kg, SpO2 95 %. Temp:  [97.3 F (36.3 C)-98.8 F (37.1 C)] 98.7 F (37.1 C) (11/03 1600) Pulse Rate:  [64-94] 90 (11/03 1600) Resp:  [13-26] 17 (11/03 1600) BP: (105-154)/(78-104) 142/88 (11/03 1600) SpO2:  [90 %-99 %] 95 % (11/03 1600) Arterial Line BP: (117-169)/(62-96) 144/89 (11/03 1300)  Physical Exam: Physical Exam Constitutional:      Appearance: He is well-developed.  HENT:     Head: Normocephalic and atraumatic.  Eyes:     Extraocular Movements: Extraocular movements intact.     Conjunctiva/sclera: Conjunctivae normal.  Cardiovascular:     Rate and Rhythm: Normal rate and regular rhythm.  Pulmonary:     Effort: Pulmonary effort is normal. No respiratory distress.     Breath sounds: No stridor. No wheezing.  Abdominal:     General: There is no distension.     Palpations: Abdomen is soft.  Musculoskeletal:        General: Normal range of motion.     Cervical back: Normal range of motion and neck supple.  Skin:    General: Skin is warm and dry.     Findings: No erythema or rash.  Neurological:     General: No focal deficit present.     Mental Status: He is alert and oriented to person, place, and time.  Psychiatric:  Mood and Affect: Mood normal.        Behavior: Behavior normal.        Thought Content: Thought content normal.        Judgment: Judgment normal.    Surgical site is clean on scalp  CBC:    BMET Recent Labs    05/04/21 0140 05/04/21 2153  NA 134*  --   K 4.1  --   CL 102  --   CO2 23  --   GLUCOSE 356*  --   BUN 9  --   CREATININE 1.22 0.92  CALCIUM 8.7*  --      Liver Panel  No results for input(s): PROT, ALBUMIN, AST, ALT, ALKPHOS, BILITOT, BILIDIR, IBILI in the last 72 hours.     Sedimentation Rate No results for input(s): ESRSEDRATE in the last 72 hours. C-Reactive Protein No results for input(s): CRP in the last 72 hours.  Micro Results: Recent Results (from the past 720 hour(s))  VZV PCR, CSF      Status: None   Collection Time: 04/29/21 10:29 AM   Specimen: Cerebrospinal Fluid  Result Value Ref Range Status   VZV PCR, CSF Negative Negative Final    Comment: (NOTE) No Varicella Zoster Virus DNA detected. Performed At: [facility] [place], [place] [id number] Rush Farmer MD HY:[id number]   CSF culture w Gram Stain     Status: None   Collection Time: 04/29/21 10:29 AM   Specimen: CSF  Result Value Ref Range Status   Specimen Description CSF  Final   Special Requests NONE  Final   Gram Stain   Final    WBC PRESENT, PREDOMINANTLY MONONUCLEAR NO ORGANISMS SEEN CYTOSPIN SMEAR    Culture   Final    NO GROWTH 3 DAYS Performed at [facility], [address] [place], [place] [address]    Report Status 05/02/2021 FINAL  Final  SARS Coronavirus 2 by RT PCR (hospital order, performed in [facility] hospital lab) Nasopharyngeal Nasopharyngeal Swab     Status: None   Collection Time: 05/04/21  7:45 AM   Specimen: Nasopharyngeal Swab  Result Value Ref Range Status   SARS Coronavirus 2 NEGATIVE NEGATIVE Final    Comment: (NOTE) SARS-CoV-2 target nucleic acids are NOT DETECTED.  The SARS-CoV-2 RNA is generally detectable in upper and lower respiratory specimens during the acute phase of infection. The lowest concentration of SARS-CoV-2 viral copies this assay can detect is 250 copies / mL. A negative result does not preclude SARS-CoV-2 infection and should not be used as the sole basis for treatment or other patient management decisions.  A negative result may occur with improper specimen collection / handling, submission of specimen other than nasopharyngeal swab, presence of viral mutation(s) within the areas targeted by this assay, and inadequate number of viral copies (<250 copies / mL). A negative result must be combined with clinical observations, patient history, and epidemiological information.  Fact Sheet for Patients:    StrictlyIdeas.no  Fact Sheet for Healthcare Providers: BankingDealers.co.za  This test is not yet approved or  cleared by the [place] FDA and has been authorized for detection and/or diagnosis of SARS-CoV-2 by FDA under an Emergency Use Authorization (EUA).  This EUA will remain in effect (meaning this test can be used) for the duration of the COVID-19 declaration under Section 564(b)(1) of the Act, 21 U.S.C. section 360bbb-3(b)(1), unless the authorization is terminated or revoked sooner.  Performed at [facility], [address], [place], [place] [address]   Aerobic/Anaerobic Culture w Gram Stain (surgical/deep wound)     Status: None (Preliminary result)   Collection Time: 05/04/21  5:45 PM   Specimen: Tissue  Result Value Ref Range Status   Specimen Description TISSUE  Final   Special Requests LEFT PARIETASL BRAIN BIOPSY  Final   Gram Stain   Final    FEW WBC PRESENT, PREDOMINANTLY MONONUCLEAR NO ORGANISMS SEEN    Culture   Final    NO GROWTH < 12 HOURS Performed at [facility], [place] [address] [place], [place] [address]    Report Status PENDING  Incomplete  MRSA Next Gen by PCR, Nasal     Status: None   Collection Time: 05/04/21  9:08 PM   Specimen: Nasal Mucosa; Nasal Swab  Result Value Ref Range Status   MRSA by PCR Next Gen NOT DETECTED NOT DETECTED Final    Comment: (NOTE) The GeneXpert MRSA Assay (FDA approved for NASAL specimens only), is one component of a comprehensive MRSA colonization surveillance program. It is not intended to diagnose MRSA infection nor to guide or monitor treatment for MRSA infections. Test performance is not FDA approved in patients less than 72 years old. Performed at [facility], [place] [address] [place], [place] [address]     Studies/Results: CT HEAD WO CONTRAST (5MM)  Result Date: 05/04/2021 CLINICAL DATA:  Status post craniotomy and brain biopsy EXAM: CT  HEAD WITHOUT CONTRAST TECHNIQUE: Contiguous axial images were obtained from the base of the skull through the vertex without intravenous contrast. COMPARISON:  None. FINDINGS: Brain: Trace pneumocephalus following left parietal approach brain biopsy. Suspect trace blood products near the craniotomy site but no other evidence of acute hemorrhage. No visible parenchymal abnormality. Vascular: No abnormal hyperdensity of the major intracranial arteries or dural venous sinuses. No intracranial atherosclerosis. Skull: Left parietal craniotomy. Sinuses/Orbits: No fluid levels or advanced mucosal thickening of the visualized paranasal sinuses. No mastoid or middle ear effusion. The orbits are normal. IMPRESSION: Trace pneumocephalus following left parietal approach brain biopsy. Suspect trace blood products near the craniotomy site but no other evidence of acute hemorrhage. Electronically Signed   By: Ulyses Jarred M.D.   On: 05/04/2021 23:51      Assessment/Plan:  INTERVAL HISTORY: He has had neurosurgery with Dr. Venetia Constable   Active Problems:   Seizure ([place])   Noninfectious gastroenteritis   History of colonic polyps   Brain metastasis ([place])   Brain lesion    Caylon Saine is a 45 y.o. 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  He had CT abdomen pelvis performed which showed inflammation at the hepatic flexure of the colon.  He underwent colonoscopy with biopsies of ulcerated inflamed areas in the colon that turned out to be benign.  Yesterday underwent neurosurgery with intraoperative findings consistent with erythematous injected cortex with no obvious focal grossly vertically metastatic pathology.  #1 Brain lesions:  Await pathology report  We will also try to send tissue to [facility] for PCR for AFB fungi  bacteria.  AFB and fungal cultures were as well as bacterial cultures have been taken.  Follow-up in [place] can be accomplished but he lives 3-[place]/2 hours away.  It would more more ideal if he could have follow-up locally in the [facility] area with infectious disease neurology and primary  care.  Am certainly though happy to see him in follow-up here when we have culture data coming back from his brain biopsy in the next 4 to 6 weeks.  I spent 36 minutes with the patient including face to face counseling of the patient personally reviewing MRI brain, updated serologies, cultures , along with eview of medical records before and during the visit and in coordination of his care.     LOS: 9 days   Alcide Evener 05/05/2021, 6:08 PM

## 2021-05-05 NOTE — Progress Notes (Signed)
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 On 10/25, he was brought to the ED by EMS after he collapsed in a store.  Patient had seizure in route to the hospital lasting about 45 seconds.  On arrival to the ED, patient was nonverbal, with right facial droop, right arm flaccidity. He had another seizure in the ED Labs showed glucose level elevated to more than 600. MRI brain showed multiple enhancing parenchymal and leptomeningeal lesions in the brain. Patient admitted to hospitalist service.   See below for details  Subjective: Patient was seen and examined this afternoon.  Pleasant, young African-American male.  Looks older for his age.  Alert, awake, oriented x3.  Per nursing staff, he was able to walk on the hallway earlier. Chart reviewed.  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 -MRI brain with lesions as above. -Neurology, ID and neurosurgery were involved. -EEG showed epileptiform activity in the left frontal anterior temporal region.  Continue Keppra. -Underwent lumbar puncture -unrevealing CSF analysis including CSF cytology negative for malignancy. -11/2, patient underwent brain biopsy by neurosurgery.  Report pending.   -Extensive infectious disease work-up in process  Colonic ulcers and polyps -As part of the work-up for unexplained brain lesions, patient underwent pan CT scan to hunt  for any possible underlying malignancy.  CT abdomen showed colonic wall thickening.  Patient underwent colonoscopy and biopsy.  No evidence of malignancy and has pursued as biopsy was pursued as the next step.  Uncontrolled type 2 diabetes mellitus with hyperglycemia -A1c more than 15.5 -Home meds include Levemir 55 units twice daily, sliding scale insulin, metformin 500 mg daily -Currently on Semglee and sliding scale insulin. -Blood sugar level Recent Labs  Lab 05/04/21 1640 05/04/21 1840 05/04/21 2103 05/05/21 0744 05/05/21 1307  GLUCAP 115* 124* 232* 284* 255*   CAD -Home meds include aspirin 81 mg daily, Lipitor 80 mg daily,  Chronic systolic and diastolic CHF s/p AICD -Home meds include Coreg 25 mg twice daily, Lasix 40 mg twice daily, losartan 100 mg daily, Aldactone 25 mg daily -Currently on Coreg 25 mg twice daily.  Others on hold.  CHF remains compensated.  ??  Any history of arrhythmia -Home meds include amiodarone 200 mg daily.  Currently continued on the same   Right lung nodule -Outpatient follow-up with pulmonologist    Mobility: Encourage ambulation Living condition: Lives at home at [facility] of care:   Code Status: Full Code  Nutritional status: Body mass index is 31.03 kg/m.     Diet:  Diet Order             Diet regular Room service appropriate? Yes; Fluid consistency: Thin  Diet effective now                  DVT prophylaxis:  heparin injection 5,000 Units Start: 05/06/21 0600 SCDs Start: 05/04/21 2027 Place TED hose Start:  05/04/21 2027   Antimicrobials: Currently not on antibiotics Fluid: Not on fluid Consultants: ID, neurosurgery Family Communication: None at bedside  Status is: Inpatient  Remains inpatient appropriate because: Pending brain biopsy report  Dispo: The patient is from: Home              Anticipated d/c is to: Home in 2 to 3 days              Patient currently is not medically stable to d/c.   Difficult  to place patient No     Infusions:   sodium chloride 10 mL/hr at 05/05/21 0600    Scheduled Meds:   stroke: mapping our early stages of recovery book   Does not apply Once   amiodarone  200 mg Oral Daily   aspirin EC  81 mg Oral Daily   atorvastatin  80 mg Oral Daily   carvedilol  25 mg Oral BID WC   Chlorhexidine Gluconate Cloth  6 each Topical Daily   docusate sodium  100 mg Oral BID   [START ON 05/06/2021] heparin injection (subcutaneous)  5,000 Units Subcutaneous Q8H   insulin aspart  0-15 Units Subcutaneous TID WC   insulin aspart  0-5 Units Subcutaneous QHS   insulin glargine-yfgn  45 Units Subcutaneous Daily   levETIRAcetam  1,500 mg Oral BID    PRN meds: acetaminophen **OR** acetaminophen, HYDROcodone-acetaminophen, HYDROmorphone (DILAUDID) injection, influenza vac split quadrivalent PF, labetalol, LORazepam, neomycin-bacitracin-polymyxin, ondansetron **OR** ondansetron (ZOFRAN) IV, polyethylene glycol, promethazine   Antimicrobials: Anti-infectives (From admission, onward)    Start     Dose/Rate Route Frequency Ordered Stop   05/04/21 2300  ceFAZolin (ANCEF) IVPB 2g/100 mL premix        2 g 200 mL/hr over 30 Minutes Intravenous Every 8 hours 05/04/21 2026 05/05/21 0642   05/04/21 1603  ceFAZolin (ANCEF) 2-4 GM/100ML-% IVPB       Note to Pharmacy: Block, Sarah   : cabinet override      05/04/21 1603 05/04/21 1721   05/04/21 1600  ceFAZolin (ANCEF) IVPB 2g/100 mL premix        2 g 200 mL/hr over 30 Minutes Intravenous  Once 05/04/21 1559 05/04/21 1705       Objective: Vitals:   05/05/21 0900 05/05/21 1200  BP: (!) 154/87   Pulse: 94   Resp: 18 (!) 26  Temp:  98.8 F (37.1 C)  SpO2: 95%     Intake/Output Summary (Last 24 hours) at 05/05/2021 1413 Last data filed at 05/05/2021 0600 Gross per 24 hour  Intake 771.8 ml  Output 1085 ml  Net -313.2 ml   Filed Weights   04/26/21 1700 04/27/21 1513 05/02/21 0844  Weight: 42.6 kg 98.1 kg 98.1 kg   Weight  change:  Body mass index is 31.03 kg/m.   Physical Exam: General exam: Pleasant, African-American male, looks older for his age.  Not in physical distress Skin: No rashes, lesions or ulcers. HEENT: Atraumatic, normocephalic, no obvious bleeding Lungs: Clear to auscultation bilaterally CVS: Regular rate and rhythm, no murmur GI/Abd soft, nontender, nondistended, bowel sound present CNS: Alert, awake, oriented x3 Psychiatry: Mood appropriate Extremities: No pedal edema, no calf tenderness  Data Review: I have personally reviewed the laboratory data and studies available.  F/u labs ordered Unresulted Labs (From admission, onward)     Start     Ordered   05/04/21 1747  Fungus Culture With Stain  RELEASE UPON ORDERING,   TIMED  Comments: Specimen A: Phone [phone] Immunocompromised?  No  Antibiotic Treatment:  ancef Is the patient on airborne/droplet precautions? No Clinical History:  N/A Special Instructions:  none Specimen Disposition:  Microbiology     05/04/21 1747   05/04/21 1747  Aerobic/Anaerobic Culture w Gram Stain (surgical/deep wound)  RELEASE UPON ORDERING,   TIMED       Comments: Specimen A: Phone [phone] Immunocompromised?  No  Antibiotic Treatment:  ancef Is the patient on airborne/droplet precautions? No Clinical History:  N/A Special Instructions:  none Specimen Disposition:  Microbiology     05/04/21 1747   05/04/21 1747  Acid Fast Culture with reflexed sensitivities  RELEASE UPON ORDERING,   TIMED       Comments: Specimen A: Phone [phone] Immunocompromised?  No  Antibiotic Treatment:  ancef Is the patient on airborne/droplet precautions? No Clinical History:  N/A Special Instructions:  none Specimen Disposition:  Microbiology     05/04/21 1747   05/04/21 1747  Acid Fast Smear (AFB)  RELEASE UPON ORDERING,   TIMED       Comments: Specimen A: Phone [phone] Immunocompromised?  No  Antibiotic Treatment:  ancef Is the patient on  airborne/droplet precautions? No Clinical History:  N/A Special Instructions:  none Specimen Disposition:  Microbiology     05/04/21 1747   05/04/21 1606  Acid Fast Smear (AFB)  (AFB smear + Culture w reflexed sensitivities panel)  Once,   R       See Hyperspace for full Linked Orders Report.   05/04/21 1607   05/04/21 1606  Acid Fast Culture with reflexed sensitivities  (AFB smear + Culture w reflexed sensitivities panel)  Once,   R       See Hyperspace for full Linked Orders Report.   05/04/21 1607   05/02/21 1135  QuantiFERON-TB Gold Plus  Once,   R       Question:  Specimen collection method  Answer:  Lab=Lab collect   05/02/21 1135   04/29/21 1030  Draw extra clot tube  RELEASE UPON ORDERING,   TIMED       Question:  Specimen collection method  Answer:  Lab=Lab collect   04/29/21 1030   04/29/21 0911  CSF IgG  Once,   R        04/29/21 0910   04/28/21 1134  Oligoclonal bands, CSF + serum  (Oligoclonal Bands, CSF + Serum panel)  Once,   R        04/28/21 1133   04/28/21 1134  Draw extra clot tube  (Oligoclonal Bands, CSF + Serum panel)  Once,   R       Question:  Specimen collection method  Answer:  Lab=Lab collect   04/28/21 1133            Signed, Terrilee Croak, MD [facility] 05/05/2021

## 2021-05-05 NOTE — Progress Notes (Signed)
Physical Therapy Treatment Patient Details Name: Jeremy Burns MRN: [id number] DOB: 07-08-1975 Today's Date: 05/05/2021   History of Present Illness Pt is a 45 year old male who presented to [facility] after found down in his tractor-trailer and EMS witnessed a 45-second episode of seizure activity with bilateral upper extremity general tonic-clonic behavior, resolved after about 2 minutes followed by inability to raise his right arm. MRI revealed multiple enhancing parenchymal and leptomeningeal lesion in the left  frontoparietal and insular region. Pt is now s/p brain biopsy on 05/04/2021. PMH significant for AICD, CHF, DM, HTN.    PT Comments    Pt progressing towards physical therapy goals. Mildly guarded this session due to increased lines and mild pain at incision site. Overall progressed to a mod I level by end of session. Added a dual task to gait training. Pt able to quickly recall months of the year backwards with 100% accuracy during ambulation, and count backwards by 7's from 100 with 80% accuracy during ambulation. Quality of gait did not decrease. Will continue to follow and progress as able per POC.    Recommendations for follow up therapy are one component of a multi-disciplinary discharge planning process, led by the attending physician.  Recommendations may be updated based on patient status, additional functional criteria and insurance authorization.  Follow Up Recommendations  No PT follow up     Assistance Recommended at Discharge PRN  Equipment Recommendations  None recommended by PT    Recommendations for Other Services       Precautions / Restrictions Precautions Precautions: Fall Precaution Comments: seizure Restrictions Weight Bearing Restrictions: No     Mobility  Bed Mobility Overal bed mobility: Modified Independent             General bed mobility comments: Guarded due to multiple lines but no assist required.    Transfers Overall transfer level:  Modified independent Equipment used: None Transfers: Sit to/from Jeremy Burns transfer comment: No assist required throughout however therapist managed lines/IV pole. Pt moving appropriately slow and aware of lines, waiting for therapist's cues for him to move to avoid pulling lines.    Ambulation/Gait Ambulation/Gait assistance: Modified independent (Device/Increase time) Gait Distance (Feet): 750 Feet Assistive device: None Gait Pattern/deviations: WFL(Within Functional Limits) Gait velocity: Decreased Gait velocity interpretation: >2.62 ft/sec, indicative of community ambulatory General Gait Details: Slow and generally steady without UE support. Guarded initially however overall ambulation  progressed to mod I as pt progressed through gait training. Motivated for distance, and pt completed 3 laps around the ICU side of 4N.   Stairs             Wheelchair Mobility    Modified Rankin (Stroke Patients Only)       Balance Overall balance assessment: Needs assistance Sitting-balance support: Feet supported Sitting balance-Leahy Scale: Normal     Standing balance support: No upper extremity supported Standing balance-Leahy Scale: Good                              Cognition Arousal/Alertness: Awake/alert Behavior During Therapy: WFL for tasks assessed/performed Overall Cognitive Status: Within Functional Limits for tasks assessed                                 General Comments:  Exercises      General Comments        Pertinent Vitals/Pain Pain Assessment: Faces Faces Pain Scale: Hurts a little bit Pain Location: incision site on head from biopsy Pain Descriptors / Indicators: Operative site guarding;Sore    Home Living                          Prior Function            PT Goals (current goals can now be found in the care plan section) Acute Rehab PT Goals Patient  Stated Goal: Be able to get home PT Goal Formulation: With patient Time For Goal Achievement: 05/11/21 Potential to Achieve Goals: Good Progress towards PT goals: Progressing toward goals    Frequency    Min 3X/week      PT Plan Current plan remains appropriate    Co-evaluation              AM-PAC PT "6 Clicks" Mobility   Outcome Measure  Help needed turning from your back to your side while in a flat bed without using bedrails?: None Help needed moving from lying on your back to sitting on the side of a flat bed without using bedrails?: None Help needed moving to and from a bed to a chair (including a wheelchair)?: None Help needed standing up from a chair using your arms (e.g., wheelchair or bedside chair)?: None Help needed to walk in hospital room?: None Help needed climbing 3-5 steps with a railing? : None 6 Click Score: 24    End of Session Equipment Utilized During Treatment: Gait belt Activity Tolerance: Patient tolerated treatment well Patient left: in chair;with call bell/phone within reach;with nursing/sitter in room Nurse Communication: Mobility status PT Visit Diagnosis: Other symptoms and signs involving the nervous system (R29.898)     Time: 0947-0962 PT Time Calculation (min) (ACUTE ONLY): 38 min  Charges:  $Gait Training: 38-52 mins                     Jeremy Burns, PT, DPT Acute Rehabilitation Services Pager: [phone] Office: [phone]    Jeremy Burns 05/05/2021, 2:16 PM

## 2021-05-05 NOTE — Progress Notes (Signed)
Neurosurgery Service Progress Note  Subjective: No acute events overnight, no new complaints, no new numbness, no speech issues  Objective: Vitals:   05/05/21 0600 05/05/21 0700 05/05/21 0800 05/05/21 0900  BP: (!) 146/92 (!) 132/99 (!) 148/93 (!) 154/87  Pulse: 89 85 87 94  Resp: (!) 22 18 (!) 22 18  Temp:   98.3 F (36.8 C)   TempSrc:   Axillary   SpO2: 94% 97% 94% 95%  Weight:      Height:        Physical Exam: AOx3, PERRL, EOMI, FS & SS, TM, Strength 5/5 x4, SILTx4, no drift, speech fluent with normal content, incision c/d/i  Assessment & Plan: 45 y.o.  man with new onset seizures, MRI with multifocal left hemipsheric enhancement with minimal edema.11/2 s/p open Bx of L posterior parietal component. Intra-op findings w/ erythematous / injected / chemiotic cortex that looked grossly abnormal like infiltrated brain but not grossly consistent with focal metastatic tumor.  -specimens sent yesterday for path, to fungal and micro labs, feel free to add on additional labs for those specimens as appropriate  -can transfer back to regular floor bed -activity / diet as tolerated -can resume DVT chemoPPx on POD2 (11/4)  Marcello Moores A Sophiana Milanese  05/05/21 9:15 AM

## 2021-05-05 NOTE — Progress Notes (Signed)
Brief GI Path Note: Colon biopsies from the ulcerated areas in the right colon showed that this was consistent with ischemia. This should heal over time with supportive care. The colon polyps that were removed were consistent with tubular adenomas. He should receive a repeat colonoscopy in 3 years for polyp surveillance. This was communicated to the primary team.   A. COLON, POLYPECTOMY:  - Tubular adenoma (s) without high grade dysplasia.   B. COLON, RIGHT, BIOPSY:  - Colonic mucosa with focal fibrosis consistent with ischemia.  - No amyloid deposition identified.  - See comment.   COMMENT:  B. There is a small focus with fibrosis in the lamina propria most  consistent with focal ischemia.  [place] red stain is performed and no  amyloid deposition is identified.

## 2021-05-05 NOTE — Progress Notes (Signed)
Occupational Therapy Treatment Note  Pt seen s/p biopsy/crani. Pt reports vision is "blurry" since seizures and he is having difficulty seeing his phone. Phone adapted to improve readability. Pt assessed with the Pillbox Test of executive level thinking and medication management. Pt made several errors, however self-corrected. Not minimal difficulty in delayed recall and pt reports difficulty with saying the "right work" at times. Given prior level of independence, recommend follow up with OT at the neuro outpt center to facilitate return to independence with IADL tasks. Pt aware he is unable to drive at this time.  Will continue to follow acutely.     05/05/21 1500  OT Visit Information  Last OT Received On 05/05/21  Assistance Needed +1  History of Present Illness Pt is a 45 year old male who presented to [facility] after found down in his tractor-trailer and EMS witnessed a 45-second episode of seizure activity with bilateral upper extremity general tonic-clonic behavior, resolved after about 2 minutes followed by inability to raise his right arm. MRI revealed multiple enhancing parenchymal and leptomeningeal lesion in the left  frontoparietal and insular region. Pt is now s/p brain biopsy on 05/04/2021. PMH significant for AICD, CHF, DM, HTN.  Precautions  Precautions Fall  Precaution Comments seizure  Pain Assessment  Pain Assessment Faces  Faces Pain Scale 2  Pain Location incision site on head from biopsy  Pain Descriptors / Indicators Operative site guarding;Sore  Pain Intervention(s) Limited activity within patient's tolerance  Cognition  Arousal/Alertness Awake/alert  Behavior During Therapy WFL for tasks assessed/performed  Overall Cognitive Status Impaired/Different from baseline  Area of Impairment Attention;Memory;Awareness  Current Attention Level Selective  Memory Decreased short-term memory  Following Commands Follows one step commands consistently  Safety/Judgement Decreased  awareness of deficits  Awareness Anticipatory  General Comments Grossly WFL. Added a dual task to gait training. Pt able to quickly recall months of the year backwards with 100% accuracy during ambulation, and count backwards by 7's from 100 with 80% accuracy during ambulation. Quality of gait did not decrease.  Upper Extremity Assessment  Upper Extremity Assessment Overall WFL for tasks assessed  Vision- Assessment  Vision Assessment? Vision impaired- to be further tested in functional context  Additional Comments Complains of vision being "blurred". Wears glasses but staets he is normally able to see his phone and it is more difficult since seizures. Font and brightness adapted on phone to increase readability.  ADL  Functional mobility during ADLs Supervision/safety  General ADL Comments Overall set up/S for bathing/dressing  Bed Mobility  Overal bed mobility Modified Independent  Transfers  Overall transfer level Modified independent  Equipment used None  Transfers Sit to/from [employer] transfer comment No assist required throughout however therapist managed lines/IV pole. Pt moving appropriately slow and aware of lines, waiting for therapist's cues for him to move to avoid pulling lines.  Balance  Overall balance assessment Needs assistance  Sitting-balance support Feet supported  Sitting balance-Leahy Scale Normal  Standing balance support No upper extremity supported  Standing balance-Leahy Scale Good  OT - End of Session  Activity Tolerance Patient tolerated treatment well  Patient left in bed;with call bell/phone within reach;with bed alarm set  Nurse Communication Mobility status;Other (comment) (DC needs)  OT Assessment/Plan  OT Plan Discharge plan remains appropriate  OT Visit Diagnosis Muscle weakness (generalized) (M62.81);Other symptoms and signs involving cognitive function;Low vision, both eyes (H54.2)  OT Frequency (ACUTE ONLY) Min 2X/week   Follow Up Recommendations Outpatient OT (Neuro outpt)  Assistance  recommended at discharge Intermittent Supervision/Assistance  OT Equipment None recommended by OT  AM-PAC OT "6 Clicks" Daily Activity Outcome Measure (Version 2)  Help from another person eating meals? 4  Help from another person taking care of personal grooming? 3  Help from another person toileting, which includes using toliet, bedpan, or urinal? 3  Help from another person bathing (including washing, rinsing, drying)? 3  Help from another person to put on and taking off regular upper body clothing? 3  Help from another person to put on and taking off regular lower body clothing? 3  6 Click Score 19  Progressive Mobility  What is the highest level of mobility based on the progressive mobility assessment? Level 6 (Walks independently in room and hall) - Balance while walking in room without assist - Complete  OT Goal Progression  Progress towards OT goals Progressing toward goals  Acute Rehab OT Goals  Patient Stated Goal grow a garden  OT Goal Formulation With patient  Time For Goal Achievement 05/11/21  Potential to Achieve Goals Good  ADL Goals  Pt Will Perform Grooming Independently;standing  Pt Will Transfer to Toilet Independently;ambulating  Additional ADL Goal #1 Pt will demonstrated ability to sustain divided attention during ADL tasks with minimal verbal cues  Additional ADL Goal #2 Pt will indep demonstrated at least one cognitive compensatory technique to promote accuracy in ADL/IADLs  OT Time Calculation  OT Start Time (ACUTE ONLY) 1402  OT Stop Time (ACUTE ONLY) 1438  OT Time Calculation (min) 36 min  OT General Charges  $OT Visit 1 Visit  OT Treatments  $Self Care/Home Management  8-22 mins  $Therapeutic Activity 8-22 mins  Maurie Boettcher, OT/L   Acute OT Clinical Specialist [place] Pager [phone] Office [phone]

## 2021-05-05 NOTE — Anesthesia Postprocedure Evaluation (Signed)
Anesthesia Post Note  Patient: Jeremy Burns  Procedure(s) Performed: LEFT STEROTACTIC OPEN BRAIN BIOPSY WITH BRAINLAB (Left)     Patient location during evaluation: PACU Anesthesia Type: General Level of consciousness: awake and alert Pain management: pain level controlled Vital Signs Assessment: post-procedure vital signs reviewed and stable Respiratory status: spontaneous breathing, nonlabored ventilation, respiratory function stable and patient connected to nasal cannula oxygen Cardiovascular status: blood pressure returned to baseline and stable Postop Assessment: no apparent nausea or vomiting Anesthetic complications: no   No notable events documented.  Last Vitals:  Vitals:   05/05/21 0500 05/05/21 0600  BP: (!) 140/99 (!) 146/92  Pulse: 83 89  Resp: (!) 21 (!) 22  Temp:    SpO2: 95% 94%    Last Pain:  Vitals:   05/05/21 0400  TempSrc: Oral  PainSc: Asleep                 March Rummage Satara Virella

## 2021-05-06 LAB — GLUCOSE, CAPILLARY
Glucose-Capillary: 263 mg/dL — ABNORMAL HIGH (ref 70–99)
Glucose-Capillary: 341 mg/dL — ABNORMAL HIGH (ref 70–99)

## 2021-05-06 LAB — ACID FAST SMEAR (AFB, MYCOBACTERIA): Acid Fast Smear: NEGATIVE

## 2021-05-06 LAB — QUANTIFERON-TB GOLD PLUS

## 2021-05-06 MED ORDER — INSULIN GLARGINE-YFGN 100 UNIT/ML ~~LOC~~ SOLN
55.0000 [IU] | Freq: Every day | SUBCUTANEOUS | Status: DC
  Administered 2021-05-06: 55 [IU] via SUBCUTANEOUS
  Filled 2021-05-06: qty 0.55

## 2021-05-06 MED ORDER — FUROSEMIDE 40 MG PO TABS: 40.0000 mg | ORAL_TABLET | Freq: Every day | ORAL | Status: DC

## 2021-05-06 MED ORDER — LEVETIRACETAM 750 MG PO TABS: 1500.0000 mg | ORAL_TABLET | Freq: Two times a day (BID) | ORAL | 2 refills | Status: DC

## 2021-05-06 MED ORDER — LEVEMIR FLEXTOUCH 100 UNIT/ML ~~LOC~~ SOPN: 55.0000 [IU] | PEN_INJECTOR | Freq: Every day | SUBCUTANEOUS | 11 refills | Status: AC

## 2021-05-06 NOTE — Progress Notes (Signed)
Provided patient with discharge instructions, printed prescriptions, and belongings. All questions answered to patient satisfaction.

## 2021-05-06 NOTE — Progress Notes (Signed)
Neurosurgery Service Progress Note  Subjective: No acute events overnight, no new complaints, no new numbness, no speech issues  Objective: Vitals:   05/06/21 0500 05/06/21 0600 05/06/21 0700 05/06/21 0800  BP: (!) 133/101 (!) 125/101 116/75 (!) 143/101  Pulse: 97 76 71 87  Resp: (!) 28 16 18  (!) 39  Temp:    98.1 F (36.7 C)  TempSrc:    Oral  SpO2: 95% 93% 93% 98%  Weight:      Height:        Physical Exam: AOx3, PERRL, EOMI, FS & SS, TM, Strength 5/5 x4, SILTx4, no drift, speech fluent with normal content, incision c/d/i  Assessment & Plan: 45 y.o.  man with new onset seizures, MRI with multifocal left hemipsheric enhancement with minimal edema.11/2 s/p open Bx of L posterior parietal component. Intra-op findings w/ erythematous / injected / chemiotic cortex that looked grossly abnormal like infiltrated brain but not grossly consistent with focal metastatic tumor.  -no further neurosurgical issues, will sign off. Given that he lives so far away, I'm okay with him having a 2 week follow up with a local MD (PCP / ID / neurology / etc). His sutures are absorbable, will put care info in his discharge instructions. Obviously happy to have him see me with any concerns or if he'd prefer to follow up with me.  -can resume DVT chemoPPx and preop aspirin today if needed  Judith Part  05/06/21 9:17 AM

## 2021-05-06 NOTE — Discharge Summary (Addendum)
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 On 10/25, he was brought to the ED by EMS after he collapsed in a store.  Patient had seizure in route to the hospital lasting about 45 seconds.  On arrival to the ED, patient was nonverbal, with right facial droop, right arm flaccidity. He had another seizure in the ED Labs showed glucose level elevated to more than 600. MRI brain showed multiple enhancing parenchymal and leptomeningeal lesions in the brain. Patient admitted to hospitalist service.   See below for details  Subjective: Patient was seen and examined this afternoon.  Pleasant, young African-American male.  Looks older for his age.  Alert, awake, oriented x3.  Per nursing staff, he was able to walk on the hallway earlier. Chart reviewed.  In last 24 hours, no fever, heart rate in 80s, blood pressure 150s  Hospital course Generalized tonic-clonic seizure Multiple enhancing parenchymal or leptomeningeal lesions in the left frontoparietal insular area -Brought by EMS for unresponsiveness, had seizure in route to the hospital and also in the ED. -MRI brain with lesions as above. -Neurology, ID and neurosurgery were involved. -EEG showed epileptiform activity in the left frontal anterior temporal region.  Currently on Keppra. -Underwent lumbar puncture  -unrevealing CSF analysis including CSF cytology negative for malignancy. -11/2, neurosurgery did (biopsy of left posterior parietal component.  Per neurosurgery note, intra-op findings w/ erythematous / injected / chemiotic cortex that looked grossly abnormal like infiltrated brain but not grossly consistent with focal metastatic tumor. -Brain biopsy report pending.  Can follow-up as an outpatient.  Sutures are absorbable. -Extensive infectious disease work-up in process.  Patient to follow-up with neurosurgery and ID as an outpatient -Patient has been given instruction for seizure precautions including no driving for 6 months till cleared by neurology as an outpatient  Seizure precautions: -Per [facility] statutes, patients with seizures are not allowed to drive until they have been seizure-free for six months.  -Use caution when using heavy equipment or power tools.  -Avoid working on ladders or at heights.  -Take showers instead of baths. Ensure the water temperature is not too high on the home water heater.  -Do not go swimming alone.  -Do not lock yourself in a room alone (i.e. bathroom). -When caring for infants or small children, sit down when holding, feeding, or changing them to minimize risk of injury to the child in the event you have a seizure.  -Maintain good sleep hygiene. -Avoid alcohol.    If patient has another seizure, call 911 and bring them back to the ED if: A.  The seizure lasts longer than 5 minutes.      B.  The patient doesn't wake shortly after the seizure or has new problems such as difficulty seeing, speaking or moving following the seizure C.  The patient was injured during  the seizure D.  The patient has a temperature over 102 F (39C) E.  The patient vomited during the seizure and now is having trouble breathing   Colonic ulcers and polyps -As part of the work-up for unexplained brain lesions, patient underwent pan CT scan to hunt for any possible  underlying malignancy.  CT abdomen showed colonic wall thickening.  Patient underwent colonoscopy and biopsy.  No evidence of malignancy and brain biopsy was pursued as the next step. -Colon biopsy showed tubular adenoma without high-grade dysplasia.  Uncontrolled type 2 diabetes mellitus with hyperglycemia -A1c more than 15.5 -Home meds include Levemir 55 units twice daily, sliding scale insulin, metformin 500 mg daily.  Unsure of his compliance as his A1c is significantly elevated. -Currently requiring less insulin in the hospital.   -I will discharge him on Levemir 55 units once a day, Premeal sliding scale insulin (as before) and metformin 5 mg daily (as before)  CAD -Home meds include aspirin 81 mg daily, Lipitor 80 mg daily -Resume the same.  Chronic systolic and diastolic CHF s/p AICD -Echo from 10/26 showed LVEF less than 20%, global hypokinesis, severely dilated LV, grade 3 diastolic dysfunction. -Home meds include Coreg 25 mg twice daily, Lasix 40 mg twice daily, losartan 100 mg daily, Aldactone 25 mg daily.  It is unclear if patient was compliant to all his medications. -Currently on Coreg 25 mg twice daily.  Others on hold.  CHF remains compensated. -At discharge, I would continue him on Coreg and resume Lasix at 40 mg daily.  Losartan and Aldactone to remain on hold for now.  Outpatient follow-up required.  ?  History of arrhythmia -Home meds include amiodarone 200 mg daily.  Currently continued on the same   Right lung nodule -Outpatient follow-up with pulmonologist    Allergies as of 05/06/2021   No Known Allergies      Medication List     STOP taking these medications    losartan 100 MG tablet Commonly known as: COZAAR   spironolactone 25 MG tablet Commonly known as: ALDACTONE       TAKE these medications    amiodarone 200 MG tablet Commonly known as: PACERONE Take 200 mg by mouth daily.   aspirin EC 81 MG tablet Take 81 mg by mouth daily. Swallow  whole.   atorvastatin 80 MG tablet Commonly known as: LIPITOR Take 80 mg by mouth daily.   carvedilol 25 MG tablet Commonly known as: COREG Take 25 mg by mouth 2 (two) times daily.   furosemide 40 MG tablet Commonly known as: LASIX Take 1 tablet (40 mg total) by mouth daily. What changed: when to take this   Levemir FlexTouch 100 UNIT/ML FlexPen Generic drug: insulin detemir Inject 55 Units into the skin daily. What changed: when to take this   levETIRAcetam 750 MG tablet Commonly known as: KEPPRA Take 2 tablets (1,500 mg total) by mouth 2 (two) times daily.   metFORMIN 500 MG 24 hr tablet Commonly known as: GLUCOPHAGE-XR Take 500 mg by mouth every morning.   multivitamin with minerals Tabs tablet Take 1 tablet by mouth daily.   NovoLIN R FlexPen ReliOn 100 UNIT/ML KwikPen Generic drug: Insulin Regular Human Inject 10 Units as directed with breakfast, with lunch, and with evening meal.   Potassium Chloride ER 20 MEQ Tbcr Take 40 mg by mouth daily.        Discharge Instructions:  Diet Recommendation: Cardiac/diabetic diet   @BRDDSCINSTRUCTIONS @  Follow ups:    Follow-up  [facility].   Contact information: [address] [place] [address] [phone]        Judith Part, MD Follow up.   Specialty: Neurosurgery Contact information: [place] [address] [phone]         Tommy Medal, Lavell Islam, MD Follow up.   Specialty: Infectious Diseases Contact information: [address] [place] [address] [phone]                 Wound care:       Discharge Exam:   Vitals:   05/06/21 1100 05/06/21 1200 05/06/21 1300 05/06/21 1400  BP: 128/85 (!) 125/98 123/83   Pulse: 83 81 74 78  Resp: 20 16 (!) 22 (!) 21  Temp:  98.2 F (36.8 C)    TempSrc:  Oral    SpO2: 96% 95% 96% (!) 89%  Weight:      Height:         Body mass index is 31.03 kg/m.  General exam: Pleasant, young African-American male.  Not in distress Skin: No rashes, lesions or ulcers. HEENT: Atraumatic, normocephalic, no obvious bleeding Lungs: Clear to auscultation bilaterally CVS: Regular rate and rhythm, no murmur GI/Abd soft, nontender, nondistended, bowels are present CNS: Alert, awake, oriented x3 Psychiatry: Mood appropriate Extremities: No pedal edema, no calf tenderness  Time coordinating discharge: 35 minutes   The results of significant diagnostics from this hospitalization (including imaging, microbiology, ancillary and laboratory) are listed below for reference.    Procedures and Diagnostic Studies:   MR BRAIN W WO CONTRAST  Result Date: 04/27/2021 CLINICAL DATA:  Seizure, abnormal neuro exam; frequent new onset left frontotemporal seizure, underlying mass? EXAM: MRI HEAD WITHOUT AND WITH CONTRAST TECHNIQUE: Multiplanar, multiecho pulse sequences of the brain and surrounding structures were obtained without and with intravenous contrast. CONTRAST:  59mL GADAVIST GADOBUTROL 1 MMOL/ML IV SOLN COMPARISON:  Head CT April 26, 2021 FINDINGS: The study is partially degraded by motion. Brain: No acute infarction, hemorrhage, hydrocephalus or extra-axial collection. Multiple enhancing lesions in the left frontoparietal and insular region with some of the lesions appear to be in the parenchyma and some appearing to have is a leptomeningeal component. Minimal associated T2 hyperintensity without significant mass effect. Vascular: Normal flow voids. Skull and upper cervical spine: Normal marrow signal. Sinuses/Orbits: Negative. Other: None. IMPRESSION: Multiple enhancing parenchymal and leptomeningeal lesion in the left frontoparietal and insular region, nonspecific. Differential diagnosis include leptomeningeal processes such as infection and granulomatous disease versus malignancy. No significant mass effect or hemorrhage.  Electronically Signed   By: Pedro Earls M.D.   On: 04/27/2021 16:47   DG Chest Portable 1 View  Result Date: 04/26/2021 CLINICAL DATA:  Altered mental status, seizure EXAM: PORTABLE CHEST 1 VIEW COMPARISON:  None. FINDINGS: Dual lead left-sided implanted cardiac device. Cardiomegaly. No focal airspace consolidation, pleural effusion, or pneumothorax. IMPRESSION: Cardiomegaly.  No focal airspace disease. Electronically Signed   By: Davina Poke D.O.   On: 04/26/2021 11:34   EEG adult  Result Date: 04/26/2021 Lora Havens, MD     04/26/2021  2:41 PM Patient Name: Dewon Mendizabal MRN: [id number] Epilepsy Attending: Lora Havens Referring Physician/Provider: Dr Donnetta Simpers Date: 04/26/2021 Duration: 25.05 mins Patient history: 45 year old male with 2 minutes of seizure activity described as right-sided twitching with right gaze deviation followed by right-sided weakness.  EEG to evaluate for seizures. Level  of alertness: Awake, asleep AEDs during EEG study: LEV Technical aspects: This EEG study was done with scalp electrodes positioned according to the 10-20 International system of electrode placement. Electrical activity was acquired at a sampling rate of 500Hz  and reviewed with a high frequency filter of 70Hz  and a low frequency filter of 1Hz . EEG data were recorded continuously and digitally stored. Description: The posterior dominant rhythm consists of 9 Hz activity of moderate voltage (25-35 uV) seen predominantly in posterior head regions, symmetric and reactive to eye opening and eye closing. Physiologic photic driving was not seen during photic stimulation.  Hyperventilation was not performed.   IMPRESSION: This study is within normal limits. No seizures or epileptiform discharges were seen throughout the recording. Priyanka Barbra Sarks   Overnight EEG with video  Result Date: 04/27/2021 Lora Havens, MD     04/28/2021  9:51 AM Patient Name: Trever Streater MRN:  [id number] Epilepsy Attending: Lora Havens Referring Physician/Provider: Dr Donnetta Simpers Duration: 04/26/2021 1406 to 1026/2022 1417  Patient history: 45 year old male with 2 minutes of seizure activity described as right-sided twitching with right gaze deviation followed by right-sided weakness.  EEG to evaluate for seizures.  Level of alertness: Awake, asleep  AEDs during EEG study: LEV  Technical aspects: This EEG study was done with scalp electrodes positioned according to the 10-20 International system of electrode placement. Electrical activity was acquired at a sampling rate of 500Hz  and reviewed with a high frequency filter of 70Hz  and a low frequency filter of 1Hz . EEG data were recorded continuously and digitally stored.  Description: The posterior dominant rhythm consists of 9 Hz activity of moderate voltage (25-35 uV) seen predominantly in posterior head regions, symmetric and reactive to eye opening and eye closing.  Sleep was characterized by meticulous, sleep spindles (12 to 14 Hz), maximal frontocentral region. Seizures were noted to be arising from left frontal-anterior temporal region.  During most of the seizures patient was asleep and no clinical signs were noted.  However, during the seizure at [place] AM on 04/27/2021 patient was working with a therapist and suddenly was noted to have trouble speaking, kept repeating " he said umm", was able to follow simple commands like sitting down on bed.  During all the seizures, EEG initially showed sharply contoured 4 to 5 Hz theta slowing in left frontal-anterior temporal region which then spread to all of left hemisphere followed by right hemisphere and evolved into 2 to 3 Hz high amplitude spike and wave activity.  There was average 1 seizure per hour, lasting about 1 to 2 minutes each.  Last seizure was noted 04/27/2021 at 1132 ABNORMALITY -Focal seizure, left frontal-anterior temporal region IMPRESSION: This study showed seizures arising from  left frontal-anterior temporal region during which patient either had no clinical signs or had trouble speaking.  There was average 1 seizure per hour, lasting about 1 to 2 minutes each.  Last seizure was on 04/27/2021 at 1132. Lora Havens   ECHOCARDIOGRAM COMPLETE  Result Date: 04/27/2021    ECHOCARDIOGRAM REPORT   Patient Name:   LELEND HEINECKE Date of Exam: 04/27/2021 Medical Rec #:  [id number]      Height:       70.0 in Accession #:    [id number]     Weight:       93.9 lb Date of Birth:  02-16-1976      BSA:          1.514 m Patient Age:    45  years       BP:           105/68 mmHg Patient Gender: M              HR:           73 bpm. Exam Location:  Inpatient Procedure: 2D Echo, Cardiac Doppler, Color Doppler and Intracardiac            Opacification Agent Indications:    Seizure  History:        Patient has no prior history of Echocardiogram examinations.                 CHF; Defibrillator.  Sonographer:    Glo Herring Referring Phys: [id number] [place]  1. Left ventricular ejection fraction, by estimation, is <20%. The left ventricle has severely decreased function. The left ventricle demonstrates global hypokinesis. The left ventricular internal cavity size was severely dilated. Left ventricular diastolic parameters are consistent with Grade III diastolic dysfunction (restrictive). No LV thrombus was noted.  2. Right ventricular systolic function is mildly reduced. The right ventricular size is normal. Tricuspid regurgitation signal is inadequate for assessing PA pressure.  3. Left atrial size was moderately dilated.  4. The mitral valve is normal in structure. Moderate mitral valve regurgitation, likely functional due to dilated annulus. No evidence of mitral stenosis.  5. The aortic valve is tricuspid. Aortic valve regurgitation is not visualized. No aortic stenosis is present.  6. The inferior vena cava is dilated in size with >50% respiratory variability, suggesting right atrial  pressure of 8 mmHg. FINDINGS  Left Ventricle: Left ventricular ejection fraction, by estimation, is <20%. The left ventricle has severely decreased function. The left ventricle demonstrates global hypokinesis. The left ventricular internal cavity size was severely dilated. There is no left ventricular hypertrophy. Left ventricular diastolic parameters are consistent with Grade III diastolic dysfunction (restrictive). Right Ventricle: The right ventricular size is normal. No increase in right ventricular wall thickness. Right ventricular systolic function is mildly reduced. Tricuspid regurgitation signal is inadequate for assessing PA pressure. Left Atrium: Left atrial size was moderately dilated. Right Atrium: Right atrial size was normal in size. Pericardium: There is no evidence of pericardial effusion. Mitral Valve: The mitral valve is normal in structure. Moderate mitral valve regurgitation. No evidence of mitral valve stenosis. Tricuspid Valve: The tricuspid valve is normal in structure. Tricuspid valve regurgitation is not demonstrated. Aortic Valve: The aortic valve is tricuspid. Aortic valve regurgitation is not visualized. No aortic stenosis is present. Pulmonic Valve: The pulmonic valve was normal in structure. Pulmonic valve regurgitation is not visualized. Aorta: The aortic root is normal in size and structure. Venous: The inferior vena cava is dilated in size with greater than 50% respiratory variability, suggesting right atrial pressure of 8 mmHg. IAS/Shunts: No atrial level shunt detected by color flow Doppler. Additional Comments: A device lead is visualized in the right ventricle.  LEFT VENTRICLE PLAX 2D LVIDd:         7.80 cm Diastology LVIDs:         7.50 cm LV e' medial:    5.48 cm/s LV PW:         1.20 cm LV E/e' medial:  16.8 LV IVS:        1.20 cm LV e' lateral:   6.00 cm/s                        LV E/e' lateral: 15.4  RIGHT VENTRICLE            IVC RV S prime:     8.40 cm/s  IVC diam: 2.20 cm  LEFT ATRIUM              Index LA diam:        4.80 cm  3.17 cm/m LA Vol (A2C):   133.0 ml 87.86 ml/m LA Vol (A4C):   80.3 ml  53.05 ml/m LA Biplane Vol: 108.0 ml 71.34 ml/m   AORTA Ao Root diam: 3.50 cm MITRAL VALVE MV Area (PHT): 5.27 cm MV Decel Time: 144 msec MV E velocity: 92.10 cm/s MV A velocity: 68.60 cm/s MV E/A ratio:  1.34 [place] McleanMD Electronically signed by Franki Monte Signature Date/Time: 04/27/2021/4:13:14 PM    Final    CT HEAD CODE STROKE WO CONTRAST  Result Date: 04/26/2021 CLINICAL DATA:  Code stroke.  Neuro deficit, acute, stroke suspected EXAM: CT HEAD WITHOUT CONTRAST TECHNIQUE: Contiguous axial images were obtained from the base of the skull through the vertex without intravenous contrast. COMPARISON:  None. FINDINGS: Brain: Small area of hypoattenuation and loss of gray differentiation in the posterior left insula (series 3, image 16; series 6, image 39). No acute hemorrhage. No mass lesion, midline shift, extra-axial fluid collection, or hydrocephalus. Vascular: No hyperdense vessel identified. Skull: No acute fracture. Sinuses/Orbits: No substantial paranasal sinus disease. Unremarkable orbits. Other: No mastoid effusions. ASPECTS [facility] Stroke Program Early CT Score) - Ganglionic level infarction (caudate, lentiform nuclei, internal capsule, insula, M1-M3 cortex): 6 - Supraganglionic infarction (M4-M6 cortex): 3 Total score (0-10 with 10 being normal): 9 IMPRESSION: 1. Small area of hypoattenuation and loss of gray differentiation in the posterior left insula, possibly small left acute MCA territory infarct versus artifact. ASPECTS 9. MRI could further evaluate if clinically indicated. 2. No acute hemorrhage. Code stroke imaging results were communicated on 04/26/2021 at 10:05 am to provider Dr. Lorrin Goodell via telephone, who verbally acknowledged these results. Electronically Signed   By: Margaretha Sheffield M.D.   On: 04/26/2021 10:10   CT ANGIO HEAD NECK W WO CM (CODE  STROKE)  Result Date: 04/26/2021 CLINICAL DATA:  Stroke/TIA, assess intracranial arteries EXAM: CT ANGIOGRAPHY HEAD AND NECK TECHNIQUE: Multidetector CT imaging of the head and neck was performed using the standard protocol during bolus administration of intravenous contrast. Multiplanar CT image reconstructions and MIPs were obtained to evaluate the vascular anatomy. Carotid stenosis measurements (when applicable) are obtained utilizing NASCET criteria, using the distal internal carotid diameter as the denominator. CONTRAST:  61mL OMNIPAQUE IOHEXOL 350 MG/ML SOLN COMPARISON:  None. FINDINGS: CTA NECK FINDINGS Aortic arch: Great vessel origins are patent. Right carotid system: No evidence of dissection, stenosis (50% or greater) or occlusion. Left carotid system: No evidence of dissection, stenosis (50% or greater) or occlusion. Vertebral arteries: Left dominant. No evidence of dissection, stenosis (50% or greater) or occlusion. Skeleton: No evidence of acute abnormality. Other neck: No acute abnormality. Upper chest: Patchy opacities in the visualized lung apices Review of the MIP images confirms the above findings CTA HEAD FINDINGS Evaluation limited by venous contamination.  Within this limitation: Anterior circulation: Bilateral intracranial ICAs, MCAs, and ACAs are patent without evidence of proximal hemodynamically significant stenosis. Limited evaluation the distal MCA vessels due to venous contamination. Posterior circulation: Small/non dominant right vertebral artery. Bilateral intradural vertebral arteries, basilar artery, and posterior cerebral arteries are patent. Mild stenosis of the distal left intradural vertebral artery. Mild multifocal narrowing of bilateral PCAs. No aneurysm identified.  Venous sinuses: No evidence of dural venous sinus thrombosis. Review of the MIP images confirms the above findings IMPRESSION: CTA head: Limited study due to venous contamination. No evidence of large vessel  occlusion or proximal hemodynamically significant stenosis. CTA neck: 1. No significant (greater than 50%) stenosis. 2. Patchy opacities in the visualized lung apices, potentially pneumonia or aspiration but evaluation is limited due to expiratory imaging. Recommend dedicated chest imaging. Urgent findings were communicated on 04/26/2021 at 10:21 am to provider Dr. Lorrin Goodell via telephone, who verbally acknowledged these results. Electronically Signed   By: Margaretha Sheffield M.D.   On: 04/26/2021 10:48     Labs:   Basic Metabolic Panel: Recent Labs  Lab 04/30/21 0618 05/01/21 0319 05/04/21 0140 05/04/21 2153  NA 134* 136 134*  --   K 4.1 3.9 4.1  --   CL 101 104 102  --   CO2 23 25 23   --   GLUCOSE 187* 240* 356*  --   BUN 9 15 9   --   CREATININE 0.95 1.02 1.22 0.92  CALCIUM 9.0 8.9 8.7*  --    GFR Estimated Creatinine Clearance: 120.3 mL/min (by C-G formula based on SCr of 0.92 mg/dL). Liver Function Tests: Recent Labs  Lab 05/01/21 0319  AST 14*  ALT 15  ALKPHOS 67  BILITOT 0.4  PROT 6.0*  ALBUMIN 2.9*   No results for input(s): LIPASE, AMYLASE in the last 168 hours. No results for input(s): AMMONIA in the last 168 hours. Coagulation profile No results for input(s): INR, PROTIME in the last 168 hours.  CBC: Recent Labs  Lab 04/30/21 0618 05/01/21 0319 05/04/21 0140 05/04/21 2153  WBC 7.2 7.1 6.2 7.7  HGB 14.5 14.0 12.9* 13.6  HCT 44.3 43.5 40.4 42.0  MCV 90.8 91.2 92.7 92.5  PLT 251 252 244 266   Cardiac Enzymes: No results for input(s): CKTOTAL, CKMB, CKMBINDEX, TROPONINI in the last 168 hours. BNP: Invalid input(s): POCBNP CBG: Recent Labs  Lab 05/05/21 1307 05/05/21 1552 05/05/21 2158 05/06/21 0752 05/06/21 1145  GLUCAP 255* 283* 269* 263* 341*   D-Dimer No results for input(s): DDIMER in the last 72 hours. Hgb A1c No results for input(s): HGBA1C in the last 72 hours. Lipid Profile No results for input(s): CHOL, HDL, LDLCALC, TRIG,  CHOLHDL, LDLDIRECT in the last 72 hours. Thyroid function studies No results for input(s): TSH, T4TOTAL, T3FREE, THYROIDAB in the last 72 hours.  Invalid input(s): FREET3 Anemia work up No results for input(s): VITAMINB12, FOLATE, FERRITIN, TIBC, IRON, RETICCTPCT in the last 72 hours. Microbiology Recent Results (from the past 240 hour(s))  VZV PCR, CSF     Status: None   Collection Time: 04/29/21 10:29 AM   Specimen: Cerebrospinal Fluid  Result Value Ref Range Status   VZV PCR, CSF Negative Negative Final    Comment: (NOTE) No Varicella Zoster Virus DNA detected. Performed At: [facility] [place], [place] [id number] Rush Farmer MD KG:[id number]   CSF culture w Gram Stain     Status: None   Collection Time: 04/29/21 10:29 AM   Specimen: CSF  Result Value Ref Range Status   Specimen Description CSF  Final   Special Requests NONE  Final   Gram Stain   Final    WBC PRESENT, PREDOMINANTLY MONONUCLEAR NO ORGANISMS SEEN CYTOSPIN SMEAR    Culture   Final    NO GROWTH 3 DAYS Performed at [facility], [address] [address] [place], [place] [address]  Report Status 05/02/2021 FINAL  Final  SARS Coronavirus 2 by RT PCR (hospital order, performed in [facility] hospital lab) Nasopharyngeal Nasopharyngeal Swab     Status: None   Collection Time: 05/04/21  7:45 AM   Specimen: Nasopharyngeal Swab  Result Value Ref Range Status   SARS Coronavirus 2 NEGATIVE NEGATIVE Final    Comment: (NOTE) SARS-CoV-2 target nucleic acids are NOT DETECTED.  The SARS-CoV-2 RNA is generally detectable in upper and lower respiratory specimens during the acute phase of infection. The lowest concentration of SARS-CoV-2 viral copies this assay can detect is 250 copies / mL. A negative result does not preclude SARS-CoV-2 infection and should not be used as the sole basis for treatment or other patient management decisions.  A negative result may occur with improper specimen  collection / handling, submission of specimen other than nasopharyngeal swab, presence of viral mutation(s) within the areas targeted by this assay, and inadequate number of viral copies (<250 copies / mL). A negative result must be combined with clinical observations, patient history, and epidemiological information.  Fact Sheet for Patients:   StrictlyIdeas.no  Fact Sheet for Healthcare Providers: BankingDealers.co.za  This test is not yet approved or  cleared by the [place] FDA and has been authorized for detection and/or diagnosis of SARS-CoV-2 by FDA under an Emergency Use Authorization (EUA).  This EUA will remain in effect (meaning this test can be used) for the duration of the COVID-19 declaration under Section 564(b)(1) of the Act, 21 U.S.C. section 360bbb-3(b)(1), unless the authorization is terminated or revoked sooner.  Performed at [facility], [place] [address], [place], [place] [address]   Acid Fast Smear (AFB)     Status: None   Collection Time: 05/04/21  5:45 PM   Specimen: PATH Brain biopsy  Result Value Ref Range Status   AFB Specimen Processing Concentration  Final   Acid Fast Smear Negative  Final    Comment: (NOTE) Performed At: [facility] [place], [place] [id number] Rush Farmer MD IR:[id number]    Source (AFB) LEFT PARIETASL BRAIN BIOPSY  Final    Comment: Performed at [facility], [place] [address] [place], [place] [address]  Aerobic/Anaerobic Culture w Gram Stain (surgical/deep wound)     Status: None (Preliminary result)   Collection Time: 05/04/21  5:45 PM   Specimen: Tissue  Result Value Ref Range Status   Specimen Description TISSUE  Final   Special Requests LEFT PARIETASL BRAIN BIOPSY  Final   Gram Stain   Final    FEW WBC PRESENT, PREDOMINANTLY MONONUCLEAR NO ORGANISMS SEEN    Culture   Final    NO GROWTH 2 DAYS Performed at [facility],  [place] [address], [place], [place] [address]    Report Status PENDING  Incomplete  MRSA Next Gen by PCR, Nasal     Status: None   Collection Time: 05/04/21  9:08 PM   Specimen: Nasal Mucosa; Nasal Swab  Result Value Ref Range Status   MRSA by PCR Next Gen NOT DETECTED NOT DETECTED Final    Comment: (NOTE) The GeneXpert MRSA Assay (FDA approved for NASAL specimens only), is one component of a comprehensive MRSA colonization surveillance program. It is not intended to diagnose MRSA infection nor to guide or monitor treatment for MRSA infections. Test performance is not FDA approved in patients less than 77 years old. Performed at [facility], [place] [address] [place], [place] [address]  Signed: Terrilee Croak  [facility] 05/06/2021, 2:18 PM

## 2021-05-06 NOTE — Plan of Care (Signed)
  RD consulted for nutrition education regarding diabetes.  Per pt he is still drinking regular sodas. He feels that his body no longer responds to his medicine which is why his blood sugars are high.   Lab Results  Component Value Date   HGBA1C >15.5 (H) 04/26/2021    RD provided "Plate Method" handout from the Academy of Nutrition and Dietetics. Discussed different food groups and their effects on blood sugar, emphasizing carbohydrate-containing foods. Provided list of carbohydrates and recommended serving sizes of common foods.  Discussed importance of controlled and consistent carbohydrate intake throughout the day. Provided examples of ways to balance meals/snacks and encouraged intake of high-fiber, whole grain complex carbohydrates. Teach back method used.  Unsure of pt's willingness to comply.  Body mass index is 31.03 kg/m. Pt meets criteria for obesity class 1 based on current BMI.  Current diet order is Regular diet, patient is consuming approximately 100% of meals at this time. Labs and medications reviewed. No further nutrition interventions warranted at this time. RD contact information provided. If additional nutrition issues arise, please re-consult RD.  Lockie Pares., RD, LDN, CNSC See AMiON for contact information

## 2021-05-06 NOTE — Discharge Instructions (Addendum)
Neurosurgery Discharge Instructions  No restriction in activities, slowly increase your activity back to normal.   Your incision is closed with absorbable sutures. These will naturally fall off over the next 4-6 weeks. If they become bothersome or cause discomfort, apply some antibiotic ointment like bacitracin or neosporin on the sutures. This will soften them up and usually makes them more comfortable while they dissolve.  Okay to shower. Be gentle when cleaning your incision. Use regular soap and water. If that is uncomfortable, try using baby shampoo. Do not submerge the wound under water for 2 weeks after surgery.  For neurosurgery follow up, you need to have your wound checked by a physician. You can see a doctor nearby your home or have one of your established physicians (primary care, infectious disease doctor, neurologist, etc) look at your incision 2-3 weeks after surgery. If you prefer, Dr. Zada Finders is happy to see you in follow up between 2-3 weeks after discharge. His office number is [phone] to schedule a follow up appointment. If you have any concerns or questions, please call the office and let us know.  Please tell the doctor at follow up that you have absorbable sutures that do not need to be removed, they will fall out on their own. If they ask for details, you can tell them that they are Monocryl sutures. If there are any concerns about the wound then please call Dr. Zada Finders to let him know.      Carbohydrate Counting For People With Diabetes  Foods with carbohydrates make your blood glucose level go up. Learning how to count carbohydrates can help you control your blood glucose levels. First, identify the foods you eat that contain carbohydrates. Then, using the Foods with Carbohydrates chart, determine about how much carbohydrates are in your meals and snacks. Make sure you are eating foods with fiber, protein, and healthy fat along with your carbohydrate foods. Foods  with Carbohydrates The following table shows carbohydrate foods that have about 15 grams of carbohydrate each. Using measuring cups, spoons, or a food scale when you first begin learning about carbohydrate counting can help you learn about the portion sizes you typically eat. The following foods have 15 grams carbohydrate each:  Grains 1 slice bread (1 ounce)  1 small tortilla (6-inch size)   large bagel (1 ounce)  1/3 cup pasta or rice (cooked)   hamburger or hot dog bun ( ounce)   cup cooked cereal   to  cup ready-to-eat cereal  2 taco shells (5-inch size) Fruit 1 small fresh fruit ( to 1 cup)   medium banana  17 small grapes (3 ounces)  1 cup melon or berries   cup canned or frozen fruit  2 tablespoons dried fruit (blueberries, cherries, cranberries, raisins)   cup unsweetened fruit juice  Starchy Vegetables  cup cooked beans, peas, corn, potatoes/sweet potatoes   large baked potato (3 ounces)  1 cup acorn or butternut squash  Snack Foods 3 to 6 crackers  8 potato chips or 13 tortilla chips ( ounce to 1 ounce)  3 cups popped popcorn  Dairy 3/4 cup (6 ounces) nonfat plain yogurt, or yogurt with sugar-free sweetener  1 cup milk  1 cup plain rice, soy, coconut or flavored almond milk Sweets and Desserts  cup ice cream or frozen yogurt  1 tablespoon jam, jelly, pancake syrup, table sugar, or honey  2 tablespoons light pancake syrup  1 inch square of frosted cake or 2 inch square of unfrosted cake  2 small cookies (2/3 ounce each) or  large cookie  Sometimes you'll have to estimate carbohydrate amounts if you don't know the exact recipe. One cup of mixed foods like soups can have 1 to 2 carbohydrate servings, while some casseroles might have 2 or more servings of carbohydrate. Foods that have less than 20 calories in each serving can be counted as "free" foods. Count 1 cup raw vegetables, or  cup cooked non-starchy vegetables as "free" foods. If you eat 3 or more  servings at one meal, then count them as 1 carbohydrate serving.  Foods without Carbohydrates  Not all foods contain carbohydrates. Meat, some dairy, fats, non-starchy vegetables, and many beverages don't contain carbohydrate. So when you count carbohydrates, you can generally exclude chicken, pork, beef, fish, seafood, eggs, tofu, cheese, butter, sour cream, avocado, nuts, seeds, olives, mayonnaise, water, black coffee, unsweetened tea, and zero-calorie drinks. Vegetables with no or low carbohydrate include green beans, cauliflower, tomatoes, and onions. How much carbohydrate should I eat at each meal?  Carbohydrate counting can help you plan your meals and manage your weight. Following are some starting points for carbohydrate intake at each meal. Work with your registered dietitian nutritionist to find the best range that works for your blood glucose and weight.   To Lose Weight To Maintain Weight  Women 2 - 3 carb servings 3 - 4 carb servings  Men 3 - 4 carb servings 4 - 5 carb servings  Checking your blood glucose after meals will help you know if you need to adjust the timing, type, or number of carbohydrate servings in your meal plan. Achieve and keep a healthy body weight by balancing your food intake and physical activity.  Tips How should I plan my meals?  Plan for half the food on your plate to include non-starchy vegetables, like salad greens, broccoli, or carrots. Try to eat 3 to 5 servings of non-starchy vegetables every day. Have a protein food at each meal. Protein foods include chicken, fish, meat, eggs, or beans (note that beans contain carbohydrate). These two food groups (non-starchy vegetables and proteins) are low in carbohydrate. If you fill up your plate with these foods, you will eat less carbohydrate but still fill up your stomach. Try to limit your carbohydrate portion to  of the plate.  What fats are healthiest to eat?  Diabetes increases risk for heart disease. To help  protect your heart, eat more healthy fats, such as olive oil, nuts, and avocado. Eat less saturated fats like butter, cream, and high-fat meats, like bacon and sausage. Avoid trans fats, which are in all foods that list "partially hydrogenated oil" as an ingredient. What should I drink?  Choose drinks that are not sweetened with sugar. The healthiest choices are water, carbonated or seltzer waters, and tea and coffee without added sugars.  Sweet drinks will make your blood glucose go up very quickly. One serving of soda or energy drink is  cup. It is best to drink these beverages only if your blood glucose is low.  Artificially sweetened, or diet drinks, typically do not increase your blood glucose if they have zero calories in them. Read labels of beverages, as some diet drinks do have carbohydrate and will raise your blood glucose. Label Reading Tips Read Nutrition Facts labels to find out how many grams of carbohydrate are in a food you want to eat. Don't forget: sometimes serving sizes on the label aren't the same as how much food you are going  to eat, so you may need to calculate how much carbohydrate is in the food you are serving yourself.   Carbohydrate Counting for People with Diabetes Sample 1-Day Menu  Breakfast  cup yogurt, low fat, low sugar (1 carbohydrate serving)   cup cereal, ready-to-eat, unsweetened (1 carbohydrate serving)  1 cup strawberries (1 carbohydrate serving)   cup almonds ( carbohydrate serving)  Lunch 1, 5 ounce can chunk light tuna  2 ounces cheese, low fat cheddar  6 whole wheat crackers (1 carbohydrate serving)  1 small apple (1 carbohydrate servings)   cup carrots ( carbohydrate serving)   cup snap peas  1 cup 1% milk (1 carbohydrate serving)   Evening Meal Stir fry made with: 3 ounces chicken  1 cup Ursin rice (3 carbohydrate servings)   cup broccoli ( carbohydrate serving)   cup green beans   cup onions  1 tablespoon olive oil  2 tablespoons  teriyaki sauce ( carbohydrate serving)  Evening Snack 1 extra small banana (1 carbohydrate serving)  1 tablespoon peanut butter   Carbohydrate Counting for People with Diabetes Vegan Sample 1-Day Menu  Breakfast 1 cup cooked oatmeal (2 carbohydrate servings)   cup blueberries (1 carbohydrate serving)  2 tablespoons flaxseeds  1 cup soymilk fortified with calcium and vitamin D  1 cup coffee  Lunch 2 slices whole wheat bread (2 carbohydrate servings)   cup baked tofu   cup lettuce  2 slices tomato  2 slices avocado   cup baby carrots ( carbohydrate serving)  1 orange (1 carbohydrate serving)  1 cup soymilk fortified with calcium and vitamin D   Evening Meal Burrito made with: 1 6-inch corn tortilla (1 carbohydrate serving)  1 cup refried vegetarian beans (2 carbohydrate servings)   cup chopped tomatoes   cup lettuce   cup salsa  1/3 cup Brumett rice (1 carbohydrate serving)  1 tablespoon olive oil for rice   cup zucchini   Evening Snack 6 small whole grain crackers (1 carbohydrate serving)  2 apricots ( carbohydrate serving)   cup unsalted peanuts ( carbohydrate serving)    Carbohydrate Counting for People with Diabetes Vegetarian (Lacto-Ovo) Sample 1-Day Menu  Breakfast 1 cup cooked oatmeal (2 carbohydrate servings)   cup blueberries (1 carbohydrate serving)  2 tablespoons flaxseeds  1 egg  1 cup 1% milk (1 carbohydrate serving)  1 cup coffee  Lunch 2 slices whole wheat bread (2 carbohydrate servings)  2 ounces low-fat cheese   cup lettuce  2 slices tomato  2 slices avocado   cup baby carrots ( carbohydrate serving)  1 orange (1 carbohydrate serving)  1 cup unsweetened tea  Evening Meal Burrito made with: 1 6-inch corn tortilla (1 carbohydrate serving)   cup refried vegetarian beans (1 carbohydrate serving)   cup tomatoes   cup lettuce   cup salsa  1/3 cup Weatherspoon rice (1 carbohydrate serving)  1 tablespoon olive oil for rice   cup zucchini  1 cup  1% milk (1 carbohydrate serving)  Evening Snack 6 small whole grain crackers (1 carbohydrate serving)  2 apricots ( carbohydrate serving)   cup unsalted peanuts ( carbohydrate serving)    Copyright 2020  Academy of Nutrition and Dietetics. All rights reserved.  Using Nutrition Labels: Carbohydrate  Serving Size  Look at the serving size. All the information on the label is based on this portion. Servings Per Container  The number of servings contained in the package. Guidelines for Carbohydrate  Look at the total grams  of carbohydrate in the serving size.  1 carbohydrate choice = 15 grams of carbohydrate. Range of Carbohydrate Grams Per Choice  Carbohydrate Grams/Choice Carbohydrate Choices  6-10   11-20 1  21-25 1  26-35 2  36-40 2  41-50 3  51-55 3  56-65 4  66-70 4  71-80 5    Copyright 2020  Academy of Nutrition and Dietetics. All rights reserved.

## 2021-05-10 ENCOUNTER — Encounter: Payer: Self-pay | Admitting: Infectious Disease

## 2021-05-10 ENCOUNTER — Other Ambulatory Visit: Payer: Self-pay

## 2021-05-10 ENCOUNTER — Telehealth (INDEPENDENT_AMBULATORY_CARE_PROVIDER_SITE_OTHER): Payer: Medicare PPO | Admitting: Infectious Disease

## 2021-05-10 DIAGNOSIS — Z95810 Presence of automatic (implantable) cardiac defibrillator: Secondary | ICD-10-CM | POA: Diagnosis not present

## 2021-05-10 DIAGNOSIS — R569 Unspecified convulsions: Secondary | ICD-10-CM

## 2021-05-10 DIAGNOSIS — C7931 Secondary malignant neoplasm of brain: Secondary | ICD-10-CM | POA: Diagnosis not present

## 2021-05-10 DIAGNOSIS — E119 Type 2 diabetes mellitus without complications: Secondary | ICD-10-CM

## 2021-05-10 DIAGNOSIS — G939 Disorder of brain, unspecified: Secondary | ICD-10-CM | POA: Diagnosis not present

## 2021-05-10 LAB — AEROBIC/ANAEROBIC CULTURE W GRAM STAIN (SURGICAL/DEEP WOUND): Culture: NO GROWTH

## 2021-05-10 LAB — SURGICAL PATHOLOGY

## 2021-05-10 NOTE — Progress Notes (Signed)
Virtual Visit via Telephone Note  I connected with Jeremy Burns on 05/10/21 at  9:15 AM EST by telephone and verified that I am speaking with the correct person using two identifiers.  Location: Patient: Home Provider: [facility]   I discussed the limitations, risks, security and privacy concerns of performing an evaluation and management service by telephone and the availability of in person appointments. I also discussed with the patient that there may be a patient responsible charge related to this service. The patient expressed understanding and agreed to proceed.   History of Present Illness:   Jeremy Burns is a 45 y.o. 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  He had CT abdomen pelvis performed which showed inflammation at the hepatic flexure of the colon.  He underwent colonoscopy with biopsies of ulcerated inflamed areas in the colon that turned out to be benign.  Yesterday underwent neurosurgery with intraoperative findings consistent with erythematous injected cortex with no obvious focal grossly vertically metastatic pathology.   I last saw him on Thursday prior to his being discharged she was discharged from the hospital is returned to [facility].  Path report was not back last Friday.  I called pathology today.  Apparently Dr. Saralyn Pilar is ordered special stains that have been performed but the specimen of the brain has not yet been read there is also plans apparently send off specimen to [facility].  I also had tried to have sent specimen sent to [facility] for PCR testing in case as infectious cause of his brain lesions.  He is otherwise doing well and has had no seizures.  He is making an appoint with primary care and also with his cardiologist unfortunately his sister died recently from what  sounds like a respiratory virus.  Note the lady in the chart that is listed is as a sister is actually the woman who drives trucks with him.     Observations/Objective:  Jeremy Burns appear to be doing well over the telephone.    Assessment and Plan:  Brain lesions:  Pathology will give me a call later today if they have initial prelim read on this.  As mentioned it may be sent off to [facility] potentially specimen sent to [facility] for PCR testing.  Cultures from the lesion are unrevealing on routine culture media I do not see anything back from AFB stain and culture or fungal stain and culture.  Serologies were pertinent for negative cryptococcal antigen histoplasma antigen urine negative Toxoplasma serologies.  QuantiFERON gold was never performed with insufficient serum being collected.    Follow Up Instructions:  I have made an appointment for him to do another video call next week when I should have more information back from pathology.   I discussed the assessment and treatment plan with the patient. The patient was provided an opportunity to ask questions and all were answered. The patient agreed with the plan and demonstrated an understanding of the instructions.   The patient was advised to call back or seek an in-person evaluation if the symptoms worsen or if the condition fails to improve as anticipated.  I provided 21 minutes of non-face-to-face time during this encounter.   Alcide Evener, MD

## 2021-05-19 ENCOUNTER — Other Ambulatory Visit: Payer: Self-pay | Admitting: Infectious Disease

## 2021-05-19 ENCOUNTER — Telehealth (INDEPENDENT_AMBULATORY_CARE_PROVIDER_SITE_OTHER): Payer: Medicare PPO | Admitting: Infectious Disease

## 2021-05-19 ENCOUNTER — Other Ambulatory Visit: Payer: Self-pay

## 2021-05-19 ENCOUNTER — Encounter: Payer: Self-pay | Admitting: Infectious Disease

## 2021-05-19 DIAGNOSIS — S06891A Other specified intracranial injury with loss of consciousness of 30 minutes or less, initial encounter: Secondary | ICD-10-CM

## 2021-05-19 DIAGNOSIS — R569 Unspecified convulsions: Secondary | ICD-10-CM | POA: Diagnosis not present

## 2021-05-19 DIAGNOSIS — E119 Type 2 diabetes mellitus without complications: Secondary | ICD-10-CM | POA: Diagnosis not present

## 2021-05-19 DIAGNOSIS — G939 Disorder of brain, unspecified: Secondary | ICD-10-CM | POA: Diagnosis not present

## 2021-05-19 DIAGNOSIS — I509 Heart failure, unspecified: Secondary | ICD-10-CM | POA: Diagnosis not present

## 2021-05-19 DIAGNOSIS — S0689AA Other specified intracranial injury with loss of consciousness status unknown, initial encounter: Secondary | ICD-10-CM | POA: Insufficient documentation

## 2021-05-19 MED ORDER — LEVETIRACETAM 750 MG PO TABS: 1500.0000 mg | ORAL_TABLET | Freq: Two times a day (BID) | ORAL | 5 refills | Status: AC

## 2021-05-19 MED ORDER — LEVETIRACETAM 750 MG PO TABS: 1500.0000 mg | ORAL_TABLET | Freq: Two times a day (BID) | ORAL | 5 refills | Status: DC

## 2021-05-19 NOTE — Progress Notes (Signed)
Virtual Visit via Video Note  I connected with Jeremy Burns on 05/19/21 at  9:00 AM EST by a video enabled telemedicine application and verified that I am speaking with the correct person using two identifiers.  Location: Patient: Home Provider:[facility]    I discussed the limitations of evaluation and management by telemedicine and the availability of in person appointments. The patient expressed understanding and agreed to proceed.  History of Present Illness:  Jeremy Burns is a 45 y.o. 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  He had CT abdomen pelvis performed which showed inflammation at the hepatic flexure of the colon.  He underwent colonoscopy with biopsies of ulcerated inflamed areas in the colon that turned out to be benign.  Yesterday underwent neurosurgery with intraoperative findings consistent with erythematous injected cortex with no obvious focal grossly vertically metastatic pathology.    I last saw him on Thursday prior to his being discharged she was discharged from the hospital is returned to [facility].   I also had tried to have sent specimen sent to [facility] for PCR testing in case as infectious cause of his brain lesions.   When I last connected with Jeremy Burns the path report was not yet back but today I looked into the chart and it in fact is back the pathology showed benign dura mater benign parenchymal tissue with presence of histiocytes.  Pathologist believes that the findings they have seen including after having given special stains are consistent with reaction to a hematoma that may be resolving.  This is certainly consistent potentially with his history he did have a fall in 2021 due to syncope where he struck his skull against the pavement.  Is possible he could have bled at  that time and then his seizures happened later.  He still has Keppra with him with a bottle.  He is going to see his primary care physician in December and also his cardiologist he has not established care yet with a neurologist.  He is doing relatively well he has no fevers chills or systemic symptoms to suggest infection he has gained weight he believes due to fluid retention in the context of his heart failure.  Review of systems as HPI otherwise 12 point review of systems is negative   Past Medical History:  Diagnosis Date   AICD (automatic cardioverter/defibrillator) present    AICD (automatic cardioverter/defibrillator) present    CHF (congestive heart failure) (HCC)    CHF (congestive heart failure) ([place])    Diabetes mellitus without complication ([place])    Diabetes mellitus without complication ([place])    Hypertension    Intracranial hematoma    Presence of permanent cardiac pacemaker     Past Surgical History:  Procedure Laterality Date   BIOPSY  05/02/2021   Procedure: BIOPSY;  Surgeon: [name], MD;  Location: [place];  Service: Gastroenterology;;   COLONOSCOPY N/A 05/02/2021   Procedure: COLONOSCOPY;  Surgeon: [name], MD;  Location: [place];  Service: Gastroenterology;  Laterality: N/A;   FRAMELESS  BIOPSY WITH BRAINLAB Left 05/04/2021   Procedure: LEFT STEROTACTIC OPEN BRAIN BIOPSY WITH BRAINLAB;  Surgeon: Judith Part, MD;  Location: [place];  Service: Neurosurgery;  Laterality: Left;   POLYPECTOMY  05/02/2021   Procedure: POLYPECTOMY;  Surgeon: [name], MD;  Location: [facility] ENDOSCOPY;  Service: Gastroenterology;;  No family history on file.    Social History   Socioeconomic History   Marital status: Single    Spouse name: Not on file   Number of children: Not on file   Years of education: Not on file   Highest education level: Not on file  Occupational History   Not on file  Tobacco Use   Smoking status: Former    Types:  Cigarettes   Smokeless tobacco: Never  Substance and Sexual Activity   Alcohol use: Not on file   Drug use: Not on file   Sexual activity: Not on file  Other Topics Concern   Not on file  Social History Narrative   Not on file   Social Determinants of Health   Financial Resource Strain: Not on file  Food Insecurity: Not on file  Transportation Needs: Not on file  Physical Activity: Not on file  Stress: Not on file  Social Connections: Not on file    No Known Allergies   Current Outpatient Medications:    amiodarone (PACERONE) 200 MG tablet, Take 200 mg by mouth daily., Disp: , Rfl:    aspirin EC 81 MG tablet, Take 81 mg by mouth daily. Swallow whole., Disp: , Rfl:    atorvastatin (LIPITOR) 80 MG tablet, Take 80 mg by mouth daily., Disp: , Rfl:    carvedilol (COREG) 25 MG tablet, Take 25 mg by mouth 2 (two) times daily., Disp: , Rfl:    furosemide (LASIX) 40 MG tablet, Take 1 tablet (40 mg total) by mouth daily., Disp: 30 tablet, Rfl:    Insulin Regular Human (NOVOLIN R FLEXPEN RELION) 100 UNIT/ML KwikPen, Inject 10 Units as directed with breakfast, with lunch, and with evening meal., Disp: , Rfl:    LEVEMIR FLEXTOUCH 100 UNIT/ML FlexTouch Pen, Inject 55 Units into the skin daily., Disp: 15 mL, Rfl: 11   metFORMIN (GLUCOPHAGE-XR) 500 MG 24 hr tablet, Take 500 mg by mouth every morning., Disp: , Rfl:    Multiple Vitamin (MULTIVITAMIN WITH MINERALS) TABS tablet, Take 1 tablet by mouth daily., Disp: , Rfl:    Potassium Chloride ER 20 MEQ TBCR, Take 40 mg by mouth daily., Disp: , Rfl:    levETIRAcetam (KEPPRA) 750 MG tablet, Take 2 tablets (1,500 mg total) by mouth 2 (two) times daily., Disp: 120 tablet, Rfl: 5    Observations/Objective:  Jeremy Burns appeared well over the Video and was in no acute distress  Assessment and Plan:  Brain lesions:  Pathology believe that they are consistent with hematomas that are resolving.  There were histiocytes present.  I have asked for  tissue to be sent to [facility] for PCR for bacteria TB nontuberculous mycobacteria and fungi we do have AFB cultures and fungal culture still incubating.  Seizure disorder I have sent in prescriptions of Keppra with 5 refills to his local pharmacy so that he does not go without this until neurologist assessed him and locally.  Heart failure: He has gained fluid recently and likely needs up titration of his diuretics.  Vaccine counseling he went to the pharmacy to receive COVID-19 updated booster but was told not to do so until his brain surgical sites had healed.  I told him this was not a contraindication for vaccination and that he should get his vaccination as well as with influenza.  Follow Up Instructions:    I discussed the assessment and treatment plan with the patient. The patient was provided an opportunity to ask questions and  all were answered. The patient agreed with the plan and demonstrated an understanding of the instructions.   The patient was advised to call back or seek an in-person evaluation if the symptoms worsen or if the condition fails to improve as anticipated.  I spent more than 41  minutes with the patient including face to face counseling of the patient re his brain, lesions personally reviewing MRI of brain from admission, updated pathology, updated cultures along with  review of medical records before and during the visit and in coordination of his care.   Alcide Evener, MD

## 2021-05-24 ENCOUNTER — Other Ambulatory Visit: Payer: Self-pay | Admitting: Radiation Therapy

## 2021-06-06 ENCOUNTER — Inpatient Hospital Stay: Payer: Medicare PPO | Attending: Internal Medicine

## 2021-06-07 LAB — FUNGAL ORGANISM REFLEX

## 2021-06-07 LAB — FUNGUS CULTURE RESULT

## 2021-06-07 LAB — FUNGUS CULTURE WITH STAIN

## 2021-06-08 ENCOUNTER — Other Ambulatory Visit: Payer: Self-pay | Admitting: Radiation Therapy

## 2021-06-08 ENCOUNTER — Telehealth: Payer: Self-pay | Admitting: Internal Medicine

## 2021-06-08 DIAGNOSIS — G969 Disorder of central nervous system, unspecified: Secondary | ICD-10-CM

## 2021-06-08 NOTE — Telephone Encounter (Signed)
Scheduled appt per 12/7 referral. Called pt, no answer. Left msg with appt date and time. Requested for pt to call me back to confirm appt. I went ahead and scheduled as Dr. Mickeal Skinner schedule is very full and I wanted to make sure I got an appt for pt.

## 2021-06-17 LAB — ACID FAST CULTURE WITH REFLEXED SENSITIVITIES (MYCOBACTERIA): Acid Fast Culture: NEGATIVE

## 2021-06-24 ENCOUNTER — Telehealth (INDEPENDENT_AMBULATORY_CARE_PROVIDER_SITE_OTHER): Payer: Medicare PPO | Admitting: Infectious Disease

## 2021-06-24 ENCOUNTER — Encounter: Payer: Self-pay | Admitting: Infectious Disease

## 2021-06-24 ENCOUNTER — Other Ambulatory Visit: Payer: Self-pay

## 2021-06-24 DIAGNOSIS — G939 Disorder of brain, unspecified: Secondary | ICD-10-CM

## 2021-06-24 DIAGNOSIS — S06891A Other specified intracranial injury with loss of consciousness of 30 minutes or less, initial encounter: Secondary | ICD-10-CM | POA: Diagnosis not present

## 2021-06-24 DIAGNOSIS — R569 Unspecified convulsions: Secondary | ICD-10-CM

## 2021-06-24 DIAGNOSIS — I509 Heart failure, unspecified: Secondary | ICD-10-CM

## 2021-06-24 NOTE — Progress Notes (Signed)
Virtual Visit via Video Note  I connected with Tyger Wichman on 06/24/21 at 10:45 AM EST by a video enabled telemedicine application and verified that I am speaking with the correct person using two identifiers.  Location: Patient: Home Provider:[facility]    I discussed the limitations of evaluation and management by telemedicine and the availability of in person appointments. The patient expressed understanding and agreed to proceed.  History of Present Illness:  Jeremy Burns is a 45 y.o. 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  He had CT abdomen pelvis performed which showed inflammation at the hepatic flexure of the colon.  He underwent colonoscopy with biopsies of ulcerated inflamed areas in the colon that turned out to be benign.  Yesterday underwent neurosurgery with intraoperative findings consistent with erythematous injected cortex with no obvious focal grossly vertically metastatic pathology.    I last saw him on Thursday prior to his being discharged she was discharged from the hospital is returned to [facility].   I also had tried to have sent specimen sent to [facility] for PCR testing in case as infectious cause of his brain lesions.   When I first connected with Alik the path report was not yet back but today I looked into the chart and it in fact is back the pathology showed benign dura mater benign parenchymal tissue with presence of histiocytes.  Pathologist believes that the findings they have seen including after having given special stains are consistent with reaction to a hematoma that may be resolving.  This is certainly consistent potentially with his history he did have a fall in 2021 due to syncope where he struck his skull against the pavement.  Is possible he could have bled at  that time and then his seizures happened later.  He still has Keppra with him with a bottle.  When I last connected with him via video feed I did not yet have PCR's back from his tissue and so we scheduled this appointment to go over that information.  I still did not have it yesterday but the PCR's were performed and pathology was able to fax me over the forms which show that he has PCR's that are negative for bacteria fungi and TB and nontuberculous mycobacteria.  PCR for Bartonella was also negative.   Has seen cardiology locally who have increased his diuretics and helped him lose weight.  He is still on Keppra but not seeing neurology which is critical for him to do.  He has had no fevers chills nausea or other systemic symptoms to suggest infection.  Otherwise 12 point review of systems is negative [name] was in no acute distress on video feed  Past Medical History:  Diagnosis Date   AICD (automatic cardioverter/defibrillator) present    AICD (automatic cardioverter/defibrillator) present    CHF (congestive heart failure) (HCC)    CHF (congestive heart failure) ([place])    Diabetes mellitus without complication ([place])    Diabetes mellitus without complication ([place])    Hypertension    Intracranial hematoma    Presence of permanent cardiac pacemaker     Past Surgical History:  Procedure Laterality Date   BIOPSY  05/02/2021   Procedure: BIOPSY;  Surgeon: [name], MD;  Location: [place];  Service: Gastroenterology;;   COLONOSCOPY N/A 05/02/2021   Procedure: COLONOSCOPY;  Surgeon: [name], MD;  Location: [facility] ENDOSCOPY;  Service: Gastroenterology;  Laterality: N/A;   FRAMELESS  BIOPSY WITH BRAINLAB Left 05/04/2021   Procedure: LEFT STEROTACTIC OPEN BRAIN BIOPSY WITH BRAINLAB;  Surgeon: Judith Part, MD;  Location: [place];  Service: Neurosurgery;  Laterality: Left;   POLYPECTOMY  05/02/2021   Procedure: POLYPECTOMY;  Surgeon: [name], MD;  Location: [facility]  ENDOSCOPY;  Service: Gastroenterology;;    No family history on file.    Social History   Socioeconomic History   Marital status: Single    Spouse name: Not on file   Number of children: Not on file   Years of education: Not on file   Highest education level: Not on file  Occupational History   Not on file  Tobacco Use   Smoking status: Former    Types: Cigarettes   Smokeless tobacco: Never  Substance and Sexual Activity   Alcohol use: Not on file   Drug use: Not on file   Sexual activity: Not on file  Other Topics Concern   Not on file  Social History Narrative   Not on file   Social Determinants of Health   Financial Resource Strain: Not on file  Food Insecurity: Not on file  Transportation Needs: Not on file  Physical Activity: Not on file  Stress: Not on file  Social Connections: Not on file    No Known Allergies   Current Outpatient Medications:    amiodarone (PACERONE) 200 MG tablet, Take 200 mg by mouth daily., Disp: , Rfl:    aspirin EC 81 MG tablet, Take 81 mg by mouth daily. Swallow whole., Disp: , Rfl:    atorvastatin (LIPITOR) 80 MG tablet, Take 80 mg by mouth daily., Disp: , Rfl:    carvedilol (COREG) 25 MG tablet, Take 25 mg by mouth 2 (two) times daily., Disp: , Rfl:    furosemide (LASIX) 20 MG tablet, 1 tablet, Disp: , Rfl:    Insulin Regular Human (NOVOLIN R FLEXPEN RELION) 100 UNIT/ML KwikPen, Inject 10 Units as directed with breakfast, with lunch, and with evening meal., Disp: , Rfl:    LEVEMIR FLEXTOUCH 100 UNIT/ML FlexTouch Pen, Inject 55 Units into the skin daily., Disp: 15 mL, Rfl: 11   levETIRAcetam (KEPPRA) 750 MG tablet, Take 2 tablets (1,500 mg total) by mouth 2 (two) times daily., Disp: 120 tablet, Rfl: 5   metFORMIN (GLUCOPHAGE-XR) 500 MG 24 hr tablet, Take 500 mg by mouth every morning., Disp: , Rfl:    Multiple Vitamin (MULTIVITAMIN WITH MINERALS) TABS tablet, Take 1 tablet by mouth daily., Disp: , Rfl:    Potassium Chloride ER 20  MEQ TBCR, Take 40 mg by mouth daily., Disp: , Rfl:     Observations/Objective:  [name] was in no acute distress on video feed  Assessment and Plan:  Brain lesions:  Pathology showed that they can seem consistent with hematomas that have been resolving.  PCR's as mentioned above were all negative for bacterial fungal TB nontuberculous mycobacteria and Bartonella.   Seizure disorder: He has Keppra with refills that I also sent in but he needs to see a neurologist to determine how long he needs to be on these medications or if he can come off of them.  He also has had driving restrictions due to his seizure and he will need help from a neurologist this as well.  Heart failure: Improved after increasing his diuretic    Follow Up Instructions:    I discussed the assessment and treatment plan  with the patient. The patient was provided an opportunity to ask questions and all were answered. The patient agreed with the plan and demonstrated an understanding of the instructions.   The patient was advised to call back or seek an in-person evaluation if the symptoms worsen or if the condition fails to improve as anticipated.    Alcide Evener, MD

## 2021-06-28 ENCOUNTER — Inpatient Hospital Stay: Payer: Medicare PPO | Admitting: Internal Medicine

## 2021-06-28 ENCOUNTER — Telehealth: Payer: Self-pay | Admitting: *Deleted

## 2021-06-28 NOTE — Telephone Encounter (Signed)
Attempted to reach patient about his appointment with Dr Mickeal Skinner today.  He was a no show however, from previous scheduling message he never confirmed that this date and time would work for him.   Attempted to reach patient, left voicemail to have patient call back and then we can reschedule.

## 2021-06-30 ENCOUNTER — Telehealth: Payer: Self-pay | Admitting: Internal Medicine

## 2021-06-30 NOTE — Telephone Encounter (Signed)
Scheduled appt per 12/7 referral. Pt is aware of appt date and time. Pt is aware to arrive 15 prior to appt time.

## 2021-07-18 ENCOUNTER — Inpatient Hospital Stay: Payer: Medicare PPO | Attending: Internal Medicine | Admitting: Internal Medicine

## 2021-07-18 ENCOUNTER — Telehealth: Payer: Self-pay | Admitting: *Deleted

## 2021-07-18 NOTE — Telephone Encounter (Signed)
Called patient regarding missed appointment.  No answer, left message pending call back to reschedule.

## 2021-07-19 ENCOUNTER — Telehealth: Payer: Self-pay | Admitting: Internal Medicine

## 2021-07-19 NOTE — Telephone Encounter (Signed)
Pt had called me and left a msg about r/s his appt. I called him back today and spoke to him. He told me he would no longer be needing the new pt appt with Dr. Mickeal Skinner. He was going to try and find a provider closer to where he lives, he lives [place] from [place]. He requested I close out his referral. Referral closed per pt request.

## 2022-05-02 LAB — HISTOPLASMA ANTIGEN, URINE: Histoplasma Antigen, urine: <0.5 (ref <0.5)

## 2022-05-30 ENCOUNTER — Other Ambulatory Visit: Payer: Self-pay | Admitting: Infectious Disease

## 2022-05-30 NOTE — Telephone Encounter (Signed)
Per Dr. Derek Mound last note, neurology should take over prescription for keppra. Called patient to let him know, no answer. Left HIPAA compliant voicemail requesting callback.   Beryle Flock, RN

## 2022-10-03 ENCOUNTER — Other Ambulatory Visit: Payer: Self-pay | Admitting: Infectious Disease

## 2023-04-01 IMAGING — CT CT HEAD W/O CM
3 of 4 series · 13 of 47 positions shown, 15 images · non-contrast
Comparison: None.

CLINICAL DATA: Status post craniotomy and brain biopsy

EXAM:
CT HEAD WITHOUT CONTRAST
TECHNIQUE: Contiguous axial images were obtained from the base of the skull
through the vertex without intravenous contrast.

[Series 3: head without · axial · non-contrast · 0.49mm/px · z∈[-131,-11]mm · 7 of 33 slices shown, 9 images]
[im 5/33  brain]
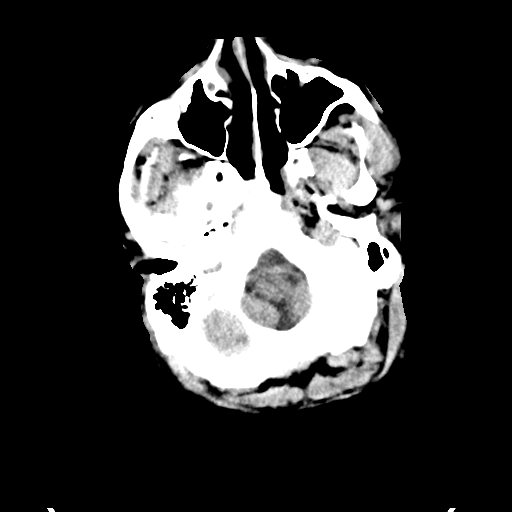
[im 5/33  bone]
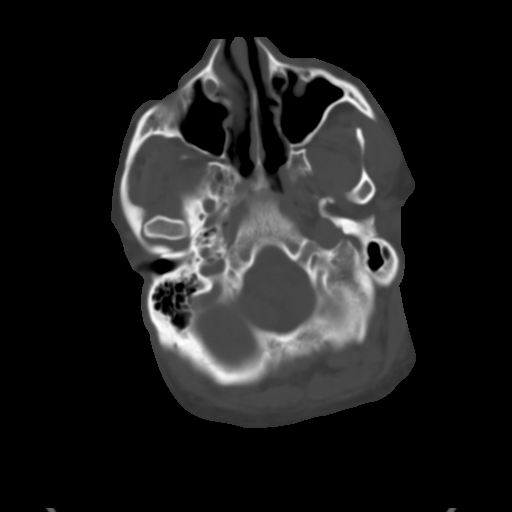
[im 9/33  brain]
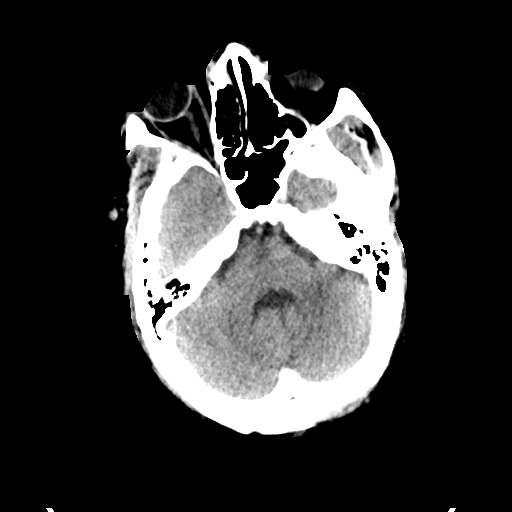
[im 13/33  brain]
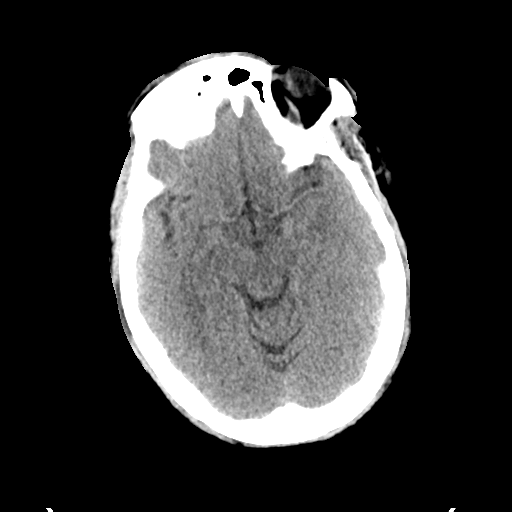
[im 17/33  brain]
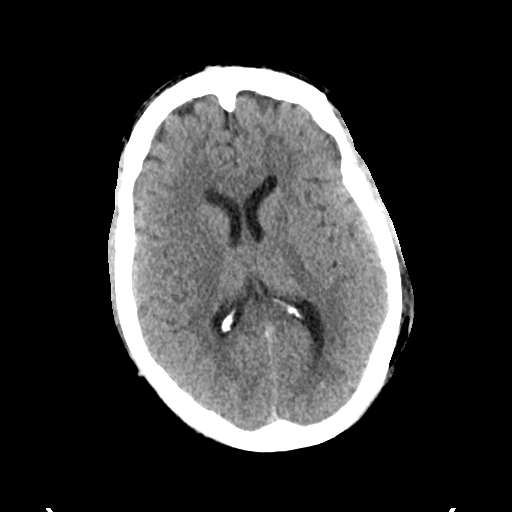
[im 21/33  brain]
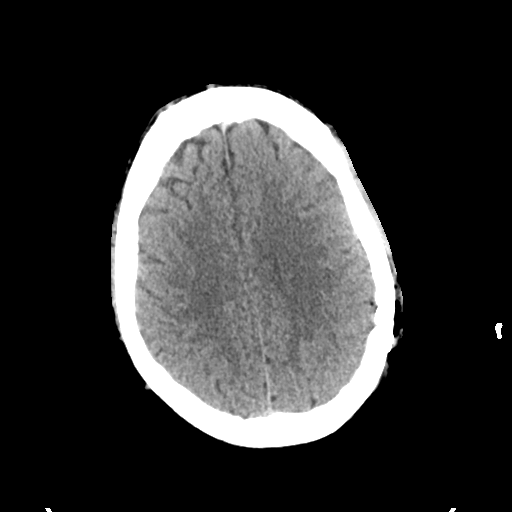
[im 21/33  bone]
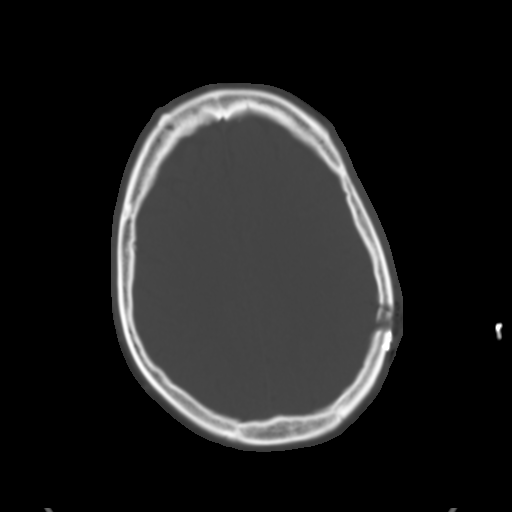
[im 25/33  brain]
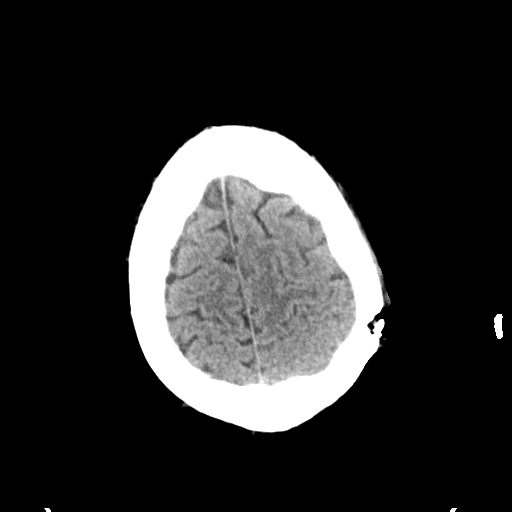
[im 29/33  brain]
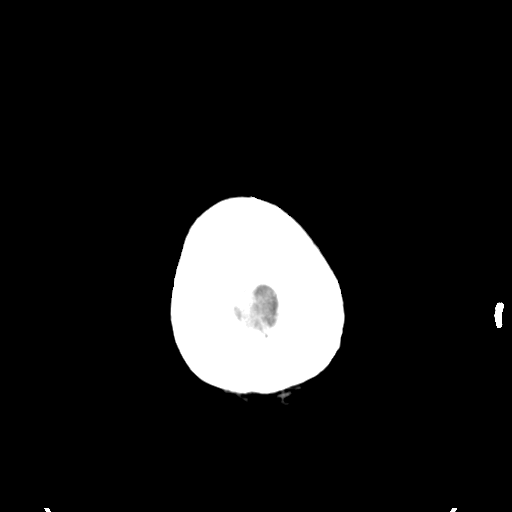

[Series 5: head without cor · coronal · non-contrast · 0.32mm/px · 3 of 71 slices shown]
[im 24/71  brain]
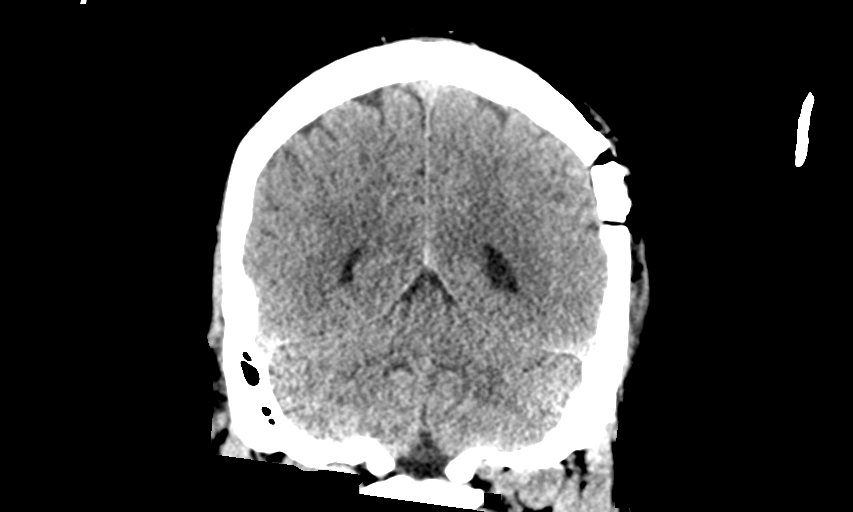
[im 32/71  brain]
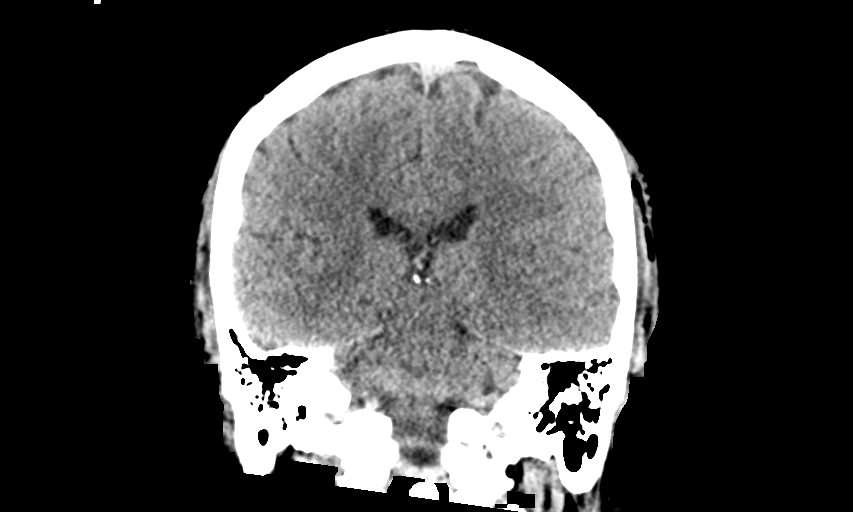
[im 39/71  brain]
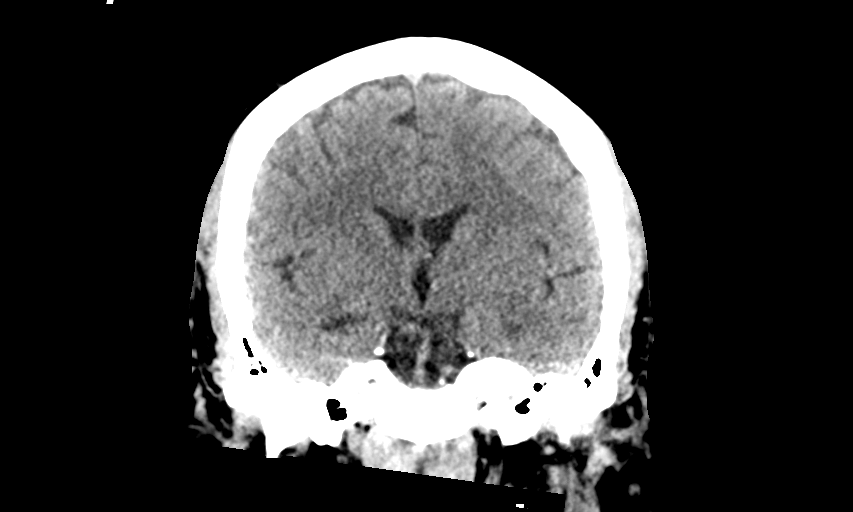

[Series 6: head without sag · sagittal · non-contrast · 0.32mm/px · 3 of 58 slices shown]
[im 23/58  brain]
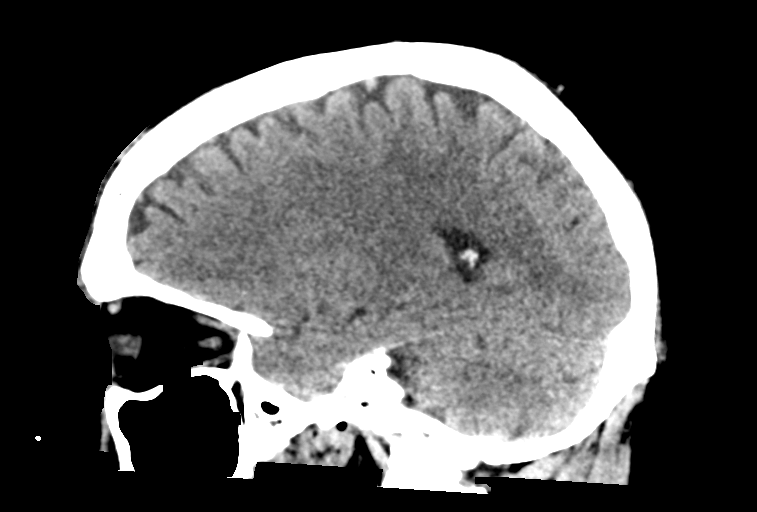
[im 29/58  brain]
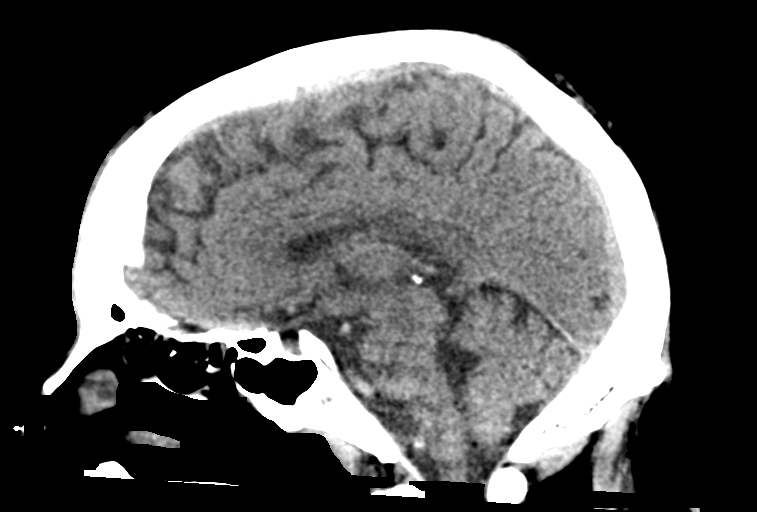
[im 36/58  brain]
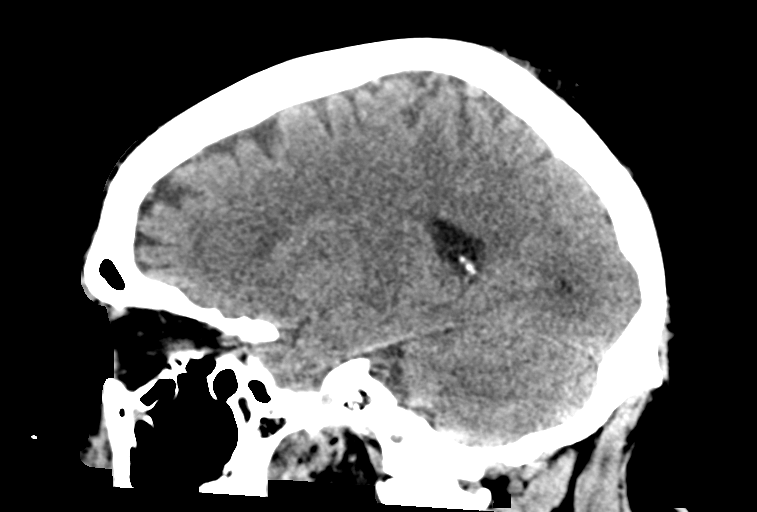

[13 of 47 positions shown; findings below may reference images not displayed]

FINDINGS: Brain: Trace pneumocephalus following left parietal approach brain
biopsy. Suspect trace blood products near the craniotomy site but no
other evidence of acute hemorrhage. No visible parenchymal
abnormality.

Vascular: No abnormal hyperdensity of the major intracranial
arteries or dural venous sinuses. No intracranial atherosclerosis.

Skull: Left parietal craniotomy.

Sinuses/Orbits: No fluid levels or advanced mucosal thickening of
the visualized paranasal sinuses. No mastoid or middle ear effusion.
The orbits are normal.
IMPRESSION: Trace pneumocephalus following left parietal approach brain biopsy.
Suspect trace blood products near the craniotomy site but no other
evidence of acute hemorrhage.
